# Patient Record
Sex: Female | Born: 1955 | Race: White | Hispanic: No | Marital: Married | Smoking: Never smoker
Health system: Southern US, Community
[De-identification: ages and names within clinical notes are randomized; demographics above are authoritative.]

## PROBLEM LIST (undated history)

## (undated) DIAGNOSIS — R110 Nausea: Secondary | ICD-10-CM

## (undated) DIAGNOSIS — R1310 Dysphagia, unspecified: Secondary | ICD-10-CM

## (undated) DIAGNOSIS — J45909 Unspecified asthma, uncomplicated: Secondary | ICD-10-CM

## (undated) DIAGNOSIS — R059 Cough, unspecified: Secondary | ICD-10-CM

## (undated) DIAGNOSIS — J029 Acute pharyngitis, unspecified: Secondary | ICD-10-CM

## (undated) DIAGNOSIS — R109 Unspecified abdominal pain: Secondary | ICD-10-CM

## (undated) DIAGNOSIS — E079 Disorder of thyroid, unspecified: Secondary | ICD-10-CM

## (undated) DIAGNOSIS — Z8639 Personal history of other endocrine, nutritional and metabolic disease: Secondary | ICD-10-CM

## (undated) DIAGNOSIS — R0982 Postnasal drip: Secondary | ICD-10-CM

## (undated) DIAGNOSIS — M549 Dorsalgia, unspecified: Secondary | ICD-10-CM

## (undated) DIAGNOSIS — R002 Palpitations: Secondary | ICD-10-CM

## (undated) DIAGNOSIS — R05 Cough: Secondary | ICD-10-CM

## (undated) DIAGNOSIS — R079 Chest pain, unspecified: Secondary | ICD-10-CM

## (undated) DIAGNOSIS — R682 Dry mouth, unspecified: Secondary | ICD-10-CM

## (undated) DIAGNOSIS — E785 Hyperlipidemia, unspecified: Secondary | ICD-10-CM

## (undated) DIAGNOSIS — E063 Autoimmune thyroiditis: Secondary | ICD-10-CM

## (undated) DIAGNOSIS — R634 Abnormal weight loss: Secondary | ICD-10-CM

## (undated) DIAGNOSIS — J3089 Other allergic rhinitis: Secondary | ICD-10-CM

## (undated) DIAGNOSIS — T17308A Unspecified foreign body in larynx causing other injury, initial encounter: Secondary | ICD-10-CM

## (undated) DIAGNOSIS — R0602 Shortness of breath: Secondary | ICD-10-CM

## (undated) DIAGNOSIS — K219 Gastro-esophageal reflux disease without esophagitis: Secondary | ICD-10-CM

## (undated) HISTORY — PX: OTHER SURGICAL HISTORY: SHX169

## (undated) HISTORY — DX: Other allergic rhinitis: J30.89

## (undated) HISTORY — DX: Abnormal weight loss: R63.4

## (undated) HISTORY — DX: Dysphagia, unspecified: R13.10

## (undated) HISTORY — DX: Chest pain, unspecified: R07.9

## (undated) HISTORY — DX: Autoimmune thyroiditis: E06.3

## (undated) HISTORY — DX: Unspecified asthma, uncomplicated: J45.909

## (undated) HISTORY — DX: Hyperlipidemia, unspecified: E78.5

## (undated) HISTORY — DX: Disorder of thyroid, unspecified: E07.9

## (undated) HISTORY — DX: Palpitations: R00.2

## (undated) HISTORY — DX: Cough: R05

## (undated) HISTORY — DX: Shortness of breath: R06.02

## (undated) HISTORY — DX: Nausea: R11.0

## (undated) HISTORY — DX: Dorsalgia, unspecified: M54.9

## (undated) HISTORY — DX: Dry mouth, unspecified: R68.2

## (undated) HISTORY — DX: Gastro-esophageal reflux disease without esophagitis: K21.9

## (undated) HISTORY — DX: Unspecified foreign body in larynx causing other injury, initial encounter: T17.308A

## (undated) HISTORY — PX: CHOLECYSTECTOMY: SHX55

## (undated) HISTORY — DX: Postnasal drip: R09.82

## (undated) HISTORY — PX: COLONOSCOPY: SHX174

## (undated) HISTORY — DX: Personal history of other endocrine, nutritional and metabolic disease: Z86.39

## (undated) HISTORY — DX: Unspecified abdominal pain: R10.9

## (undated) HISTORY — DX: Cough, unspecified: R05.9

## (undated) HISTORY — DX: Acute pharyngitis, unspecified: J02.9

---

## 1998-04-27 ENCOUNTER — Other Ambulatory Visit: Admission: RE | Admit: 1998-04-27 | Discharge: 1998-04-27 | Payer: Self-pay | Admitting: *Deleted

## 1999-03-17 ENCOUNTER — Ambulatory Visit (HOSPITAL_COMMUNITY): Admission: RE | Admit: 1999-03-17 | Discharge: 1999-03-17 | Payer: Self-pay | Admitting: Obstetrics and Gynecology

## 1999-03-17 ENCOUNTER — Encounter: Payer: Self-pay | Admitting: Obstetrics and Gynecology

## 2001-11-01 ENCOUNTER — Encounter: Admission: RE | Admit: 2001-11-01 | Discharge: 2001-11-01 | Payer: Self-pay | Admitting: Endocrinology

## 2001-11-01 ENCOUNTER — Encounter: Payer: Self-pay | Admitting: Endocrinology

## 2001-11-22 ENCOUNTER — Encounter: Payer: Self-pay | Admitting: Internal Medicine

## 2001-11-22 ENCOUNTER — Ambulatory Visit (HOSPITAL_COMMUNITY): Admission: RE | Admit: 2001-11-22 | Discharge: 2001-11-22 | Payer: Self-pay | Admitting: *Deleted

## 2001-12-12 ENCOUNTER — Ambulatory Visit (HOSPITAL_COMMUNITY): Admission: RE | Admit: 2001-12-12 | Discharge: 2001-12-12 | Payer: Self-pay

## 2002-06-28 ENCOUNTER — Ambulatory Visit (HOSPITAL_COMMUNITY): Admission: RE | Admit: 2002-06-28 | Discharge: 2002-06-28 | Payer: Self-pay | Admitting: Endocrinology

## 2002-06-28 ENCOUNTER — Encounter: Payer: Self-pay | Admitting: Internal Medicine

## 2002-06-28 ENCOUNTER — Encounter: Payer: Self-pay | Admitting: Endocrinology

## 2002-08-07 ENCOUNTER — Encounter: Payer: Self-pay | Admitting: Endocrinology

## 2002-08-07 ENCOUNTER — Encounter: Admission: RE | Admit: 2002-08-07 | Discharge: 2002-08-07 | Payer: Self-pay | Admitting: Endocrinology

## 2002-08-07 ENCOUNTER — Encounter: Payer: Self-pay | Admitting: Internal Medicine

## 2002-10-08 ENCOUNTER — Encounter: Payer: Self-pay | Admitting: General Surgery

## 2002-10-09 ENCOUNTER — Ambulatory Visit (HOSPITAL_COMMUNITY): Admission: RE | Admit: 2002-10-09 | Discharge: 2002-10-10 | Payer: Self-pay | Admitting: General Surgery

## 2002-10-09 ENCOUNTER — Encounter: Payer: Self-pay | Admitting: Internal Medicine

## 2002-10-09 ENCOUNTER — Encounter (INDEPENDENT_AMBULATORY_CARE_PROVIDER_SITE_OTHER): Payer: Self-pay | Admitting: *Deleted

## 2002-10-09 ENCOUNTER — Encounter: Payer: Self-pay | Admitting: General Surgery

## 2003-06-16 ENCOUNTER — Other Ambulatory Visit: Admission: RE | Admit: 2003-06-16 | Discharge: 2003-06-16 | Payer: Self-pay | Admitting: Obstetrics and Gynecology

## 2003-07-12 ENCOUNTER — Emergency Department (HOSPITAL_COMMUNITY): Admission: EM | Admit: 2003-07-12 | Discharge: 2003-07-13 | Payer: Self-pay | Admitting: Emergency Medicine

## 2003-09-29 ENCOUNTER — Encounter: Admission: RE | Admit: 2003-09-29 | Discharge: 2003-09-29 | Payer: Self-pay | Admitting: Obstetrics and Gynecology

## 2004-12-28 ENCOUNTER — Encounter: Admission: RE | Admit: 2004-12-28 | Discharge: 2004-12-28 | Payer: Self-pay | Admitting: Obstetrics and Gynecology

## 2005-02-10 ENCOUNTER — Encounter: Admission: RE | Admit: 2005-02-10 | Discharge: 2005-02-10 | Payer: Self-pay | Admitting: Obstetrics and Gynecology

## 2006-05-02 ENCOUNTER — Encounter: Admission: RE | Admit: 2006-05-02 | Discharge: 2006-05-02 | Payer: Self-pay | Admitting: Obstetrics and Gynecology

## 2007-05-04 ENCOUNTER — Encounter: Admission: RE | Admit: 2007-05-04 | Discharge: 2007-05-04 | Payer: Self-pay | Admitting: Obstetrics

## 2008-01-18 DIAGNOSIS — J301 Allergic rhinitis due to pollen: Secondary | ICD-10-CM | POA: Insufficient documentation

## 2008-01-18 DIAGNOSIS — J45909 Unspecified asthma, uncomplicated: Secondary | ICD-10-CM | POA: Insufficient documentation

## 2008-01-18 DIAGNOSIS — E063 Autoimmune thyroiditis: Secondary | ICD-10-CM | POA: Insufficient documentation

## 2008-01-22 ENCOUNTER — Ambulatory Visit: Payer: Self-pay | Admitting: Internal Medicine

## 2008-01-22 DIAGNOSIS — R109 Unspecified abdominal pain: Secondary | ICD-10-CM | POA: Insufficient documentation

## 2008-01-22 DIAGNOSIS — K219 Gastro-esophageal reflux disease without esophagitis: Secondary | ICD-10-CM | POA: Insufficient documentation

## 2008-01-23 LAB — CONVERTED CEMR LAB
Albumin: 3.5 g/dL (ref 3.5–5.2)
Amylase: 46 units/L (ref 27–131)
BUN: 10 mg/dL (ref 6–23)
Basophils Relative: 0.5 % (ref 0.0–3.0)
CO2: 29 meq/L (ref 19–32)
Calcium: 8.4 mg/dL (ref 8.4–10.5)
Chloride: 104 meq/L (ref 96–112)
Creatinine, Ser: 0.7 mg/dL (ref 0.4–1.2)
Eosinophils Absolute: 0.2 10*3/uL (ref 0.0–0.7)
Eosinophils Relative: 2.7 % (ref 0.0–5.0)
GFR calc Af Amer: 113 mL/min
GFR calc non Af Amer: 94 mL/min
Glucose, Bld: 111 mg/dL — ABNORMAL HIGH (ref 70–99)
Lipase: 28 units/L (ref 11.0–59.0)
Lymphocytes Relative: 29.7 % (ref 12.0–46.0)
MCV: 93.6 fL (ref 78.0–100.0)
Monocytes Relative: 9.2 % (ref 3.0–12.0)
Neutrophils Relative %: 57.9 % (ref 43.0–77.0)
Potassium: 4.1 meq/L (ref 3.5–5.1)
RBC: 4.09 M/uL (ref 3.87–5.11)
WBC: 6.3 10*3/uL (ref 4.5–10.5)

## 2008-02-05 ENCOUNTER — Telehealth: Payer: Self-pay | Admitting: Internal Medicine

## 2008-02-19 ENCOUNTER — Telehealth: Payer: Self-pay | Admitting: Internal Medicine

## 2008-02-25 ENCOUNTER — Telehealth: Payer: Self-pay | Admitting: Internal Medicine

## 2008-03-14 ENCOUNTER — Ambulatory Visit: Payer: Self-pay | Admitting: Internal Medicine

## 2008-03-18 ENCOUNTER — Ambulatory Visit: Payer: Self-pay | Admitting: Internal Medicine

## 2008-03-21 ENCOUNTER — Telehealth: Payer: Self-pay | Admitting: Internal Medicine

## 2008-03-21 ENCOUNTER — Ambulatory Visit: Payer: Self-pay | Admitting: Internal Medicine

## 2008-03-21 DIAGNOSIS — R1031 Right lower quadrant pain: Secondary | ICD-10-CM | POA: Insufficient documentation

## 2008-03-25 ENCOUNTER — Encounter: Payer: Self-pay | Admitting: Internal Medicine

## 2008-03-31 ENCOUNTER — Telehealth: Payer: Self-pay | Admitting: Internal Medicine

## 2008-12-25 ENCOUNTER — Encounter: Admission: RE | Admit: 2008-12-25 | Discharge: 2008-12-25 | Payer: Self-pay | Admitting: Obstetrics

## 2009-04-14 ENCOUNTER — Telehealth (INDEPENDENT_AMBULATORY_CARE_PROVIDER_SITE_OTHER): Payer: Self-pay | Admitting: *Deleted

## 2010-01-21 ENCOUNTER — Encounter: Admission: RE | Admit: 2010-01-21 | Discharge: 2010-01-21 | Payer: Self-pay | Admitting: Obstetrics

## 2010-01-25 ENCOUNTER — Encounter: Payer: Self-pay | Admitting: Cardiovascular Disease

## 2010-01-27 ENCOUNTER — Encounter: Payer: Self-pay | Admitting: Cardiovascular Disease

## 2010-01-28 DIAGNOSIS — R002 Palpitations: Secondary | ICD-10-CM | POA: Insufficient documentation

## 2010-01-29 ENCOUNTER — Ambulatory Visit: Payer: Self-pay | Admitting: Cardiovascular Disease

## 2010-02-09 ENCOUNTER — Encounter: Payer: Self-pay | Admitting: Cardiovascular Disease

## 2010-02-09 ENCOUNTER — Ambulatory Visit: Payer: Self-pay

## 2010-02-09 ENCOUNTER — Ambulatory Visit: Payer: Self-pay | Admitting: Cardiovascular Disease

## 2010-02-09 ENCOUNTER — Ambulatory Visit (HOSPITAL_COMMUNITY)
Admission: RE | Admit: 2010-02-09 | Discharge: 2010-02-09 | Payer: Self-pay | Source: Home / Self Care | Attending: Cardiovascular Disease | Admitting: Cardiovascular Disease

## 2010-03-17 ENCOUNTER — Ambulatory Visit: Admit: 2010-03-17 | Payer: Self-pay | Admitting: Cardiovascular Disease

## 2010-03-17 ENCOUNTER — Telehealth (INDEPENDENT_AMBULATORY_CARE_PROVIDER_SITE_OTHER): Payer: Self-pay | Admitting: *Deleted

## 2010-03-22 ENCOUNTER — Telehealth: Payer: Self-pay | Admitting: Cardiovascular Disease

## 2010-03-25 NOTE — Progress Notes (Signed)
Summary: [employer] Savings Card  Phone Note Call from Patient   Caller: Spouse Summary of Call: Husband requested new [employer] savings card for wife while on call for himself.  New card placed at front desk for Mr. Dandy to pick up. Initial call taken by: Francee Piccolo CMA Duncan Dull),  April 14, 2009 10:31 AM

## 2010-03-25 NOTE — Letter (Signed)
Summary: [facility]  [facility]   Imported By: Marylou Mccoy 01/29/2010 09:46:08  _____________________________________________________________________  External Attachment:    Type:   Image     Comment:   External Document

## 2010-03-25 NOTE — Assessment & Plan Note (Signed)
Summary: NP3/PALPITATIONS/LG   Visit Type:  Follow-up Primary Provider:  Adela Lank, MD  CC:  Heavy Palpitations / Minor CP / tachycardia.  History of Present Illness: Donna Hicks is seen at the request of Dr Juleen China.  She has had increased palpitations since Wendsday.  She is not sure what her TSH/T4 were but Dr Juleen China wanted to decrease her synthroid.  She is hesitant to do this.  I told her if her TSH was supressed this could be causing her palpitaitons.  She has no previous history of cardiac disease.  She works out 3x/week with her husband at [employer] without difficulty.  She also works full time at [employer].  Denies SSCP, syncope, dyspnea or diaphoresis.  Was started on lopresser Friday and this has helped her palpitatoins.    Current Problems (verified): 1)  Palpitations  (ICD-785.1) 2)  Abdominal Pain Right Lower Quadrant  (ICD-789.03) 3)  Special Screening For Malignant Neoplasms Colon  (ICD-V76.51) 4)  Gerd  (ICD-530.81) 5)  Abdominal Pain, Upper  (ICD-789.09) 6)  Allergic Rhinitis, Seasonal  (ICD-477.0) 7)  Hashimoto's Thyroiditis  (ICD-245.2) 8)  Asthma  (ICD-493.90)  Current Medications (verified): 1)  Synthroid 112 Mcg Tabs (Levothyroxine Sodium) .Marland Kitchen.. 1 Tablet By Mouth Once Daily 2)  Aciphex 20 Mg Tbec (Rabeprazole Sodium) .... Once Daily 3)  Vitamin D 400 Unit  Tabs (Cholecalciferol) .... Once Daily 4)  Calcium Carbonate-Vitamin D 600-400 Mg-Unit  Tabs (Calcium Carbonate-Vitamin D) .... Once Daily 5)  Multivitamins   Tabs (Multiple Vitamin) .... Once Daily 6)  Claritin-D 12 Hour 5-120 Mg Xr12h-Tab (Loratadine-Pseudoephedrine) .... As Needed 7)  Metoprolol Succinate 50 Mg Xr24h-Tab (Metoprolol Succinate) .... Take One Tablet By Mouth Daily  Allergies (verified): 1)  ! Ampicillin  Past History:  Past Medical History: Last updated: 01/28/2010 PALPITATIONS  ABDOMINAL PAIN RIGHT LOWER QUADRANT  SPECIAL SCREENING FOR MALIGNANT NEOPLASMS COLON GERD ABDOMINAL PAIN, UPPER   HASHIMOTO'S THYROIDITIS ASTHMA   Hyperreactive airway disease/asthma Perennial allergic rhinitis History of Hashimoto thyroiditis  Past Surgical History: Last updated: 01/22/2008 Appendectomy Cholecystectomy  Family History: Last updated: 01/29/2010 Family History of Breast Cancer:Mother, Sister Family History of Diabetes: Mother, Brother Family History of Liver Disease/Cirrhosis:Brother Family History of Coronary Artery Disease: father (MI at 56) Family History of Alcoholism:  Brother Family History of CVA or Stroke: Grandmother Family History of Hyperlipidemia:  Brother Family History of Hypertension: Brother Family History of Thyroid Disease: Brother  Social History: Last updated: 01/29/2010 Married, 1 son 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 (early 9604'V) Alcohol Use - no Daily Caffeine Use 4 cups/day of coffee Illicit Drug Use - no Patient gets regular exercise. 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   Vital Signs:  Patient profile:   55 year old female Height:      61 inches Weight:      128 pounds BMI:     24.27 Pulse rate:   85 / minute BP sitting:   112 / 72  (left arm) Cuff size:   regular  Vitals Entered By: Hardin Negus, RMA (January 29, 2010 3:18 PM)  Physical Exam  General:  Affect appropriate Healthy:  appears stated age HEENT: normal Neck supple with no adenopathy JVP normal no bruits no thyromegaly Lungs clear with no wheezing and good diaphragmatic motion Heart:  S1/S2 no murmur,rub, gallop or click PMI normal Abdomen: benighn, BS positve, no tenderness, no AAA no bruit.  No HSM or HJR Distal pulses intact with no bruits No edema Neuro non-focal Skin warm and dry    Impression & Recommendations:  Problem # 1:  PALPITATIONS (ICD-785.1) PAC's on exam.  Baseline ECG normal.  F/U event monitor and echo.  Will need to review thyroid studies from Dr Juleen China.  Continue BB for now Her updated medication list for this problem includes:    Metoprolol Succinate 50 Mg Xr24h-tab (Metoprolol succinate) .Marland Kitchen... Take one tablet by mouth daily  Orders: Echocardiogram (Echo) Event (Event)  Problem # 2:  HASHIMOTO'S THYROIDITIS (ICD-245.2) Certainly over-replacement could be causing this.  Will get labs from Dr Juleen China and may need free T3 and other labs as well  Patient Instructions: 1)  Your physician recommends that you schedule a follow-up appointment in: 1 month 2)  Your physician recommends that you continue on your current medications as directed. Please refer to the Current Medication list given to you today. 3)  Your physician has requested that you have an echocardiogram.  Echocardiography is a painless test that uses sound waves to create images of your heart. It provides your doctor with information about the size and shape of your heart and how well your heart's chambers and valves are working.  This procedure takes  approximately one hour. There are no restrictions for this procedure. 4)  Your physician has recommended that you wear an event monitor.  Event monitors are medical devices that record the heart's electrical activity. Doctors most often use these monitors to diagnose arrhythmias. Arrhythmias are problems with the speed or rhythm of the heartbeat. The monitor is a small, portable device. You can wear one while you do your normal daily activities. This is usually used to diagnose what is causing palpitations/syncope (passing out).   EKG Report  Procedure date:  01/25/2010  Findings:      NSR 89 Normal ECG

## 2010-03-31 NOTE — Progress Notes (Signed)
Summary: PT RTN CALL FROM LAST WEEK  Phone Note Call from Patient Call back at Work Phone [phone] Call back at EXT 100   Caller: Patient Reason for Call: Talk to Nurse, Talk to Doctor Summary of Call: PT RTN CALL FROM CHRISTINE FROM LAST WEEK PT NOT SURE WHAT THE CALL IS FOR Initial call taken by: Omer Jack,  March 22, 2010 11:16 AM  Follow-up for Phone Call        Phone Call Completed PT AWARE OF MONTOR RESULTS. Follow-up by: Scherrie Bateman, LPN,  March 22, 2010 4:18 PM

## 2010-03-31 NOTE — Progress Notes (Signed)
  Phone Note Outgoing Call   Call placed by: Scherrie Bateman, LPN,  March 17, 2010 1:10 PM Call placed to: Patient Summary of Call: LEFT MESSAGE FOR PT TO CALL BACK RE MONITOR RESULTS PER DR NISHAN NSR NO SIG ARRYTHMIAS. Initial call taken by: Scherrie Bateman, LPN,  March 17, 2010 1:10 PM  Follow-up for Phone Call        Phone call completed PT AWARE OF MONITORE RESULTS. Follow-up by: Scherrie Bateman, LPN,  March 22, 2010 4:18 PM

## 2010-04-06 ENCOUNTER — Encounter: Payer: Self-pay | Admitting: Cardiovascular Disease

## 2010-04-06 ENCOUNTER — Ambulatory Visit (INDEPENDENT_AMBULATORY_CARE_PROVIDER_SITE_OTHER): Payer: BC Managed Care – PPO | Admitting: Cardiovascular Disease

## 2010-04-06 DIAGNOSIS — R002 Palpitations: Secondary | ICD-10-CM

## 2010-04-14 NOTE — Assessment & Plan Note (Signed)
Summary: 1 month - rov - gd appt confirm=mj   Primary Provider:  Adela Lank, MD  CC:  check up.  History of Present Illness: Donna Hicks is seen at the request of Dr Juleen China in December  She  had increased palpitations TSH was suppressed and synthroid  was adjusted  She has no previous history of cardiac disease.  She works out 3x/week with her husband at [place] without difficulty.  She also works full time at [employer].  Denies SSCP, syncope, dyspnea or diaphoresis.   Echo in 12/11 was normal.  Reviewed event monitor and normal.  She indicates that eating smaller more frequent meals has helped palpitations.  Wants to try to stop BB as max HR with exercise only 125.  I told her this was fine.  Will see PRN  Current Problems (verified): 1)  Palpitations  (ICD-785.1) 2)  Abdominal Pain Right Lower Quadrant  (ICD-789.03) 3)  Special Screening For Malignant Neoplasms Colon  (ICD-V76.51) 4)  Gerd  (ICD-530.81) 5)  Abdominal Pain, Upper  (ICD-789.09) 6)  Allergic Rhinitis, Seasonal  (ICD-477.0) 7)  Hashimoto's Thyroiditis  (ICD-245.2) 8)  Asthma  (ICD-493.90)  Current Medications (verified): 1)  Synthroid 112 Mcg Tabs (Levothyroxine Sodium) .Marland Kitchen.. 1 Tablet By Mouth Once Daily....altrenating With 2)  Aciphex 20 Mg Tbec (Rabeprazole Sodium) .... Once Daily 3)  Vitamin D 400 Unit  Tabs (Cholecalciferol) .... Once Daily 4)  Calcium Carbonate-Vitamin D 600-400 Mg-Unit  Tabs (Calcium Carbonate-Vitamin D) .... Once Daily 5)  Multivitamins   Tabs (Multiple Vitamin) .... Once Daily 6)  Claritin-D 12 Hour 5-120 Mg Xr12h-Tab (Loratadine-Pseudoephedrine) .... As Needed 7)  Metoprolol Succinate 50 Mg Xr24h-Tab (Metoprolol Succinate) .... Take One Tablet By Mouth Daily  Allergies (verified): 1)  ! Ampicillin  Past History:  Past Medical History: Last updated: 01/28/2010 PALPITATIONS  ABDOMINAL PAIN RIGHT LOWER QUADRANT  SPECIAL SCREENING FOR MALIGNANT NEOPLASMS COLON GERD ABDOMINAL PAIN,  UPPER  HASHIMOTO'S THYROIDITIS ASTHMA   Hyperreactive airway disease/asthma Perennial allergic rhinitis History of Hashimoto thyroiditis  Past Surgical History: Last updated: 01/22/2008 Appendectomy Cholecystectomy  Family History: Last updated: 01/29/2010 Family History of Breast Cancer:Mother, Sister Family History of Diabetes: Mother, Brother Family History of Liver Disease/Cirrhosis:Brother Family History of Coronary Artery Disease: father (MI at 39) Family History of Alcoholism:  Brother Family History of CVA or Stroke: Grandmother Family History of Hyperlipidemia:  Brother Family History of Hypertension: Brother Family History of Thyroid Disease: Brother  Social History: Last updated: 01/29/2010 Married, 1 son 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   Vital Signs:  Patient profile:   55 year old female Height:      61 inches Weight:      131 pounds BMI:     24.84 Pulse rate:   67 / minute Resp:     12 per minute BP sitting:   107 / 79  (right arm)  Vitals Entered By: Kem Parkinson (April 06, 2010 12:09 PM)  Physical Exam  General:  Affect appropriate Healthy:  appears stated age HEENT: normal Neck supple with no adenopathy  JVP normal no bruits no thyromegaly Lungs clear with no wheezing and good diaphragmatic motion Heart:  S1/S2 no murmur,rub, gallop or click PMI normal Abdomen: benighn, BS positve, no tenderness, no AAA no bruit.  No HSM or HJR Distal pulses intact with no bruits No  edema Neuro non-focal Skin warm and dry    Impression & Recommendations:  Problem # 1:  PALPITATIONS (ICD-785.1) Benign related to diet and thyroid meds.  Improved  Triial off BB and as needed F/U Her updated medication list for this problem includes:    Metoprolol Succinate 50 Mg Xr24h-tab (Metoprolol succinate) .Marland Kitchen... Take one tablet by mouth daily

## 2010-06-04 ENCOUNTER — Encounter: Payer: BC Managed Care – PPO | Admitting: Genetic Counselor

## 2010-07-09 NOTE — Op Note (Signed)
   NAME:  Donna Hicks, Donna Hicks                        ACCOUNT NO.:  [id number]   MEDICAL RECORD NO.:  [id number]                   PATIENT TYPE:  AMB   LOCATION:  ENDO                                 FACILITY:  [facility]   PHYSICIAN:  Georgiana Spinner, M.D.                 DATE OF BIRTH:  1955-05-20   DATE OF PROCEDURE:  11/22/2001  DATE OF DISCHARGE:                                 OPERATIVE REPORT   PROCEDURE:  Upper endoscopy.   INDICATIONS:  GERD.   ANESTHESIA:  Demerol 80 mg, Versed 8 mg IV.   DESCRIPTION OF PROCEDURE:  With the patient mildly sedated in the left  lateral decubitus position, the Olympus videoscopic endoscope was inserted  in the mouth, down the pharynx under direct vision through the esophagus  which appeared normal into the stomach. The fundus, body and antrum,  duodenal bulb and second portion of duodenum all appeared normal. From this  point the endoscope was slowly withdrawn taking circumferential views of the  entire duodenal mucosa until the endoscope was pulled back into the stomach  and on retroflexion in viewing the stomach from below. The endoscope was  then straightened and withdrawn taking circumferential views of the  remaining gastric and esophageal mucosa. The patient vital signs and pulse  oximeter remained stable. The patient tolerated the procedure well with no  apparent complications.   FINDINGS:  Unremarkable examination.   PLAN:  Have the patient follow up with me as an outpatient.                                                Georgiana Spinner, M.D.    GMO/MEDQ  D:  11/22/2001  T:  11/23/2001  Job:  [id number]

## 2010-07-09 NOTE — Op Note (Signed)
NAME:  Donna Hicks, Donna Hicks                        ACCOUNT NO.:  [id number]   MEDICAL RECORD NO.:  [id number]                   PATIENT TYPE:  OIB   LOCATION:  5731                                 FACILITY:  [facility]   PHYSICIAN:  Jimmye Norman III, M.D.               DATE OF BIRTH:  09/21/1955   DATE OF PROCEDURE:  10/09/2002  DATE OF DISCHARGE:                                 OPERATIVE REPORT   PREOPERATIVE DIAGNOSIS:  Biliary dyskinesia with abdominal pain.   POSTOPERATIVE DIAGNOSIS:  1. Biliary dyskinesia with abdominal pain.  2. Significant abdominal adhesions.   PROCEDURES:  1. Laparoscopic cholecystectomy.  2. Intraoperative cholangiogram.  3. Laparoscopic enterolysis.   SURGEON:  Jimmye Norman, M.D.   ANESTHESIA:  General endotracheal.   ESTIMATED BLOOD LOSS:  Less than 20 mL.   COMPLICATIONS:  None.   CONDITION:  Stable.   INDICATION FOR OPERATION:  The patient is a 55 year old female with  significant abdominal pain in the right upper quadrant and tenderness when  lying on her right side, who comes in for an elective laparoscopic  cholecystectomy.   The patient had had previous studies which demonstrated no evidence of  gallstones.  She did have biliary dyskinesia.  A HIDA scan was abnormal.  For these reasons and her recurrent symptoms in a postprandial manner, a  laparoscopic cholecystectomy was done.   OPERATION:  The patient was taken to the operating room and placed on the  table in the supine position.  After an adequate amount of IV sedation was  given and endotracheal intubation was done, she was prepped and draped in  the usual sterile manner exposing the midline and right upper quadrant.   The patient was placed in reverse Trendelenburg.  A supraumbilical  curvilinear incision was made using a #11 blade and taken down to the  midline fascia.  It was through this midline fascia that a Veress needle was  placed into the peritoneal cavity and confirmed to be  in position with the  saline test.  Once this was done, carbon dioxide insufflation was instilled  into the peritoneal cavity up to a maximum pressure of 15 mmHg.  We then  passed the 11/12 mm cannula and trocar into the peritoneal cavity and  confirmed its intraperitoneal position using the laparoscope with attached  camera and light source.  Subsequently two right costal margin 5 mm cannulas  and a subxiphoid 11/12 mm cannula were passed under direct vision into the  peritoneal cavity.  The patient was placed in steeper reverse Trendelenburg,  the left side was tilted down, and the dissection begun.   There were adhesions to the right lobe of the liver, to the abdominal wall,  and also to the colon and the omentum.  These were taken down using blunt  dissection with the dissector.  We then dissected out the peritoneum  overlying the triangle of Calot and hepatoduodenal  triangle as we retracted  the gallbladder toward the anterior abdominal wall and the right upper  quadrant.  We were able to isolate the cystic duct and the cystic artery,  endoclipping the cystic artery with a single clip and the cystic duct along  the gallbladder side.  We made a cholecystodochotomy through which the  Reddick catheter was passed for the cholangiogram.  A picture of the  cholangiogram is attached on the chart.  It showed good proximal flow and  also distal flow into the duodenum without obstruction.  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   The catheter was removed, the cystic duct clamped using double Endoclips,  and then subsequently transected.  We then dissected the gallbladder out of  its bed.  There was minimal difficulty, using an EndoCatch bag to pull it  out through the supraumbilical site.   Once we had done so, we used an Endo-Scissors in order to take down the  adhesions that were in the left lower quadrant and lower midline of  the  abdomen and the left mid- to upper portion of the abdomen.  This was done  completely and with minimal bleeding.  Once this was done, we removed all  cannulas, irrigated with saline, and found there to be minimal bleeding.  We  removed all cannulas and subsequently closed.  The fascia was closed in the  supraumbilical are using a figure-of-eight stitch of 0 Vicryl.  Once this  was done a subcuticular stitch of 4-0 Vicryl was placed in the skin.  Sterile dressings were applied at all incisions, which were closed with 4-0  Vicryl.  We injected all sites with 0.25% Marcaine with epinephrine.  All  needle counts, sponge counts, and instrument counts were correct.  The  patient was taken to the recovery room in stable condition.                                               Kathrin Ruddy, M.D.    JW/MEDQ  D:  10/09/2002  T:  10/10/2002  Job:  [id number]

## 2010-08-02 ENCOUNTER — Other Ambulatory Visit: Payer: Self-pay | Admitting: Gastroenterology

## 2010-08-02 MED ORDER — RABEPRAZOLE SODIUM 20 MG PO TBEC: 20.0000 mg | DELAYED_RELEASE_TABLET | Freq: Every day | ORAL | Status: DC

## 2010-08-02 NOTE — Telephone Encounter (Signed)
Medication for Aciphex refilled for #30 only. Patient needs an appointment!!

## 2011-10-17 ENCOUNTER — Other Ambulatory Visit: Payer: Self-pay | Admitting: Obstetrics

## 2011-10-17 DIAGNOSIS — Z1231 Encounter for screening mammogram for malignant neoplasm of breast: Secondary | ICD-10-CM

## 2011-10-27 ENCOUNTER — Encounter: Payer: Self-pay | Admitting: *Deleted

## 2011-10-27 ENCOUNTER — Encounter: Payer: Self-pay | Admitting: Cardiovascular Disease

## 2011-11-10 ENCOUNTER — Ambulatory Visit
Admission: RE | Admit: 2011-11-10 | Discharge: 2011-11-10 | Disposition: A | Discharge status: Home / Self Care | Payer: BC Managed Care – PPO | Source: Ambulatory Visit | Attending: Obstetrics | Admitting: Obstetrics

## 2011-11-10 DIAGNOSIS — Z1231 Encounter for screening mammogram for malignant neoplasm of breast: Secondary | ICD-10-CM

## 2012-10-02 ENCOUNTER — Other Ambulatory Visit: Payer: Self-pay

## 2012-10-02 DIAGNOSIS — Z1231 Encounter for screening mammogram for malignant neoplasm of breast: Secondary | ICD-10-CM

## 2012-11-12 ENCOUNTER — Ambulatory Visit
Admission: RE | Admit: 2012-11-12 | Discharge: 2012-11-12 | Disposition: A | Discharge status: Home / Self Care | Payer: BC Managed Care – PPO | Source: Ambulatory Visit

## 2012-11-12 DIAGNOSIS — Z1231 Encounter for screening mammogram for malignant neoplasm of breast: Secondary | ICD-10-CM

## 2014-12-24 ENCOUNTER — Encounter: Payer: Self-pay | Admitting: Internal Medicine

## 2015-05-26 ENCOUNTER — Other Ambulatory Visit: Payer: Self-pay

## 2015-05-26 DIAGNOSIS — Z1231 Encounter for screening mammogram for malignant neoplasm of breast: Secondary | ICD-10-CM

## 2015-06-11 ENCOUNTER — Ambulatory Visit
Admission: RE | Admit: 2015-06-11 | Discharge: 2015-06-11 | Disposition: A | Discharge status: Home / Self Care | Payer: BLUE CROSS/BLUE SHIELD | Source: Ambulatory Visit

## 2015-06-11 DIAGNOSIS — Z1231 Encounter for screening mammogram for malignant neoplasm of breast: Secondary | ICD-10-CM

## 2016-06-27 DIAGNOSIS — G4700 Insomnia, unspecified: Secondary | ICD-10-CM | POA: Diagnosis not present

## 2016-06-27 DIAGNOSIS — R000 Tachycardia, unspecified: Secondary | ICD-10-CM | POA: Diagnosis not present

## 2016-06-27 DIAGNOSIS — E039 Hypothyroidism, unspecified: Secondary | ICD-10-CM | POA: Diagnosis not present

## 2016-08-12 DIAGNOSIS — J019 Acute sinusitis, unspecified: Secondary | ICD-10-CM | POA: Diagnosis not present

## 2016-08-22 DIAGNOSIS — E039 Hypothyroidism, unspecified: Secondary | ICD-10-CM | POA: Diagnosis not present

## 2016-12-27 DIAGNOSIS — Z23 Encounter for immunization: Secondary | ICD-10-CM | POA: Diagnosis not present

## 2017-03-20 ENCOUNTER — Other Ambulatory Visit: Payer: Self-pay | Admitting: Obstetrics

## 2017-03-20 DIAGNOSIS — Z1231 Encounter for screening mammogram for malignant neoplasm of breast: Secondary | ICD-10-CM

## 2017-03-22 ENCOUNTER — Ambulatory Visit
Admission: RE | Admit: 2017-03-22 | Discharge: 2017-03-22 | Disposition: A | Discharge status: Home / Self Care | Payer: Self-pay | Source: Ambulatory Visit | Attending: Obstetrics | Admitting: Obstetrics

## 2017-03-22 DIAGNOSIS — Z1231 Encounter for screening mammogram for malignant neoplasm of breast: Secondary | ICD-10-CM

## 2017-03-23 DIAGNOSIS — G4700 Insomnia, unspecified: Secondary | ICD-10-CM | POA: Diagnosis not present

## 2017-03-23 DIAGNOSIS — R000 Tachycardia, unspecified: Secondary | ICD-10-CM | POA: Diagnosis not present

## 2017-03-23 DIAGNOSIS — E039 Hypothyroidism, unspecified: Secondary | ICD-10-CM | POA: Diagnosis not present

## 2017-03-23 DIAGNOSIS — Z0000 Encounter for general adult medical examination without abnormal findings: Secondary | ICD-10-CM | POA: Diagnosis not present

## 2017-03-23 DIAGNOSIS — E7800 Pure hypercholesterolemia, unspecified: Secondary | ICD-10-CM | POA: Diagnosis not present

## 2017-04-14 DIAGNOSIS — E039 Hypothyroidism, unspecified: Secondary | ICD-10-CM | POA: Diagnosis not present

## 2017-04-14 DIAGNOSIS — R000 Tachycardia, unspecified: Secondary | ICD-10-CM | POA: Diagnosis not present

## 2017-04-14 DIAGNOSIS — K219 Gastro-esophageal reflux disease without esophagitis: Secondary | ICD-10-CM | POA: Diagnosis not present

## 2017-04-14 DIAGNOSIS — R079 Chest pain, unspecified: Secondary | ICD-10-CM | POA: Diagnosis not present

## 2017-04-17 ENCOUNTER — Encounter: Payer: Self-pay | Admitting: Internal Medicine

## 2017-04-18 DIAGNOSIS — Z01411 Encounter for gynecological examination (general) (routine) with abnormal findings: Secondary | ICD-10-CM | POA: Diagnosis not present

## 2017-04-18 DIAGNOSIS — N941 Unspecified dyspareunia: Secondary | ICD-10-CM | POA: Diagnosis not present

## 2017-04-18 DIAGNOSIS — M549 Dorsalgia, unspecified: Secondary | ICD-10-CM | POA: Diagnosis not present

## 2017-04-18 DIAGNOSIS — Z01419 Encounter for gynecological examination (general) (routine) without abnormal findings: Secondary | ICD-10-CM | POA: Diagnosis not present

## 2017-04-18 DIAGNOSIS — Z6826 Body mass index (BMI) 26.0-26.9, adult: Secondary | ICD-10-CM | POA: Diagnosis not present

## 2017-04-18 DIAGNOSIS — N952 Postmenopausal atrophic vaginitis: Secondary | ICD-10-CM | POA: Diagnosis not present

## 2017-05-11 ENCOUNTER — Telehealth: Payer: Self-pay | Admitting: Internal Medicine

## 2017-05-11 DIAGNOSIS — R0602 Shortness of breath: Secondary | ICD-10-CM | POA: Diagnosis not present

## 2017-05-11 DIAGNOSIS — R1013 Epigastric pain: Secondary | ICD-10-CM | POA: Diagnosis not present

## 2017-05-11 DIAGNOSIS — K219 Gastro-esophageal reflux disease without esophagitis: Secondary | ICD-10-CM | POA: Diagnosis not present

## 2017-05-11 NOTE — Telephone Encounter (Signed)
Patient has been rescheduled to 05/23/17 with Hyacinth MeekerJennifer  Lemmon, PA

## 2017-05-18 ENCOUNTER — Emergency Department (HOSPITAL_BASED_OUTPATIENT_CLINIC_OR_DEPARTMENT_OTHER): Payer: BLUE CROSS/BLUE SHIELD

## 2017-05-18 ENCOUNTER — Other Ambulatory Visit: Payer: Self-pay

## 2017-05-18 ENCOUNTER — Emergency Department (HOSPITAL_BASED_OUTPATIENT_CLINIC_OR_DEPARTMENT_OTHER)
Admission: EM | Admit: 2017-05-18 | Discharge: 2017-05-18 | Disposition: A | Discharge status: Home / Self Care | Payer: BLUE CROSS/BLUE SHIELD | Attending: Emergency Medicine | Admitting: Emergency Medicine

## 2017-05-18 ENCOUNTER — Telehealth: Payer: Self-pay | Admitting: Internal Medicine

## 2017-05-18 ENCOUNTER — Encounter (HOSPITAL_BASED_OUTPATIENT_CLINIC_OR_DEPARTMENT_OTHER): Payer: Self-pay | Admitting: *Deleted

## 2017-05-18 DIAGNOSIS — R1010 Upper abdominal pain, unspecified: Secondary | ICD-10-CM | POA: Insufficient documentation

## 2017-05-18 DIAGNOSIS — R05 Cough: Secondary | ICD-10-CM | POA: Diagnosis not present

## 2017-05-18 DIAGNOSIS — R1013 Epigastric pain: Secondary | ICD-10-CM | POA: Diagnosis not present

## 2017-05-18 DIAGNOSIS — J45909 Unspecified asthma, uncomplicated: Secondary | ICD-10-CM | POA: Diagnosis not present

## 2017-05-18 DIAGNOSIS — K219 Gastro-esophageal reflux disease without esophagitis: Secondary | ICD-10-CM | POA: Diagnosis not present

## 2017-05-18 DIAGNOSIS — R1310 Dysphagia, unspecified: Secondary | ICD-10-CM | POA: Insufficient documentation

## 2017-05-18 DIAGNOSIS — Z79899 Other long term (current) drug therapy: Secondary | ICD-10-CM | POA: Insufficient documentation

## 2017-05-18 DIAGNOSIS — R079 Chest pain, unspecified: Secondary | ICD-10-CM

## 2017-05-18 DIAGNOSIS — R0789 Other chest pain: Secondary | ICD-10-CM | POA: Diagnosis not present

## 2017-05-18 DIAGNOSIS — E063 Autoimmune thyroiditis: Secondary | ICD-10-CM | POA: Insufficient documentation

## 2017-05-18 LAB — COMPREHENSIVE METABOLIC PANEL
ALT: 16 U/L (ref 14–54)
ANION GAP: 12 (ref 5–15)
AST: 20 U/L (ref 15–41)
Albumin: 4.4 g/dL (ref 3.5–5.0)
Alkaline Phosphatase: 49 U/L (ref 38–126)
BILIRUBIN TOTAL: 0.5 mg/dL (ref 0.3–1.2)
BUN: 9 mg/dL (ref 6–20)
CHLORIDE: 100 mmol/L — AB (ref 101–111)
CO2: 26 mmol/L (ref 22–32)
Calcium: 8.8 mg/dL — ABNORMAL LOW (ref 8.9–10.3)
Creatinine, Ser: 0.79 mg/dL (ref 0.44–1.00)
GFR calc Af Amer: >60 mL/min (ref >60)
Glucose, Bld: 107 mg/dL — ABNORMAL HIGH (ref 65–99)
POTASSIUM: 3.5 mmol/L (ref 3.5–5.1)
Sodium: 138 mmol/L (ref 135–145)
TOTAL PROTEIN: 6.8 g/dL (ref 6.5–8.1)

## 2017-05-18 LAB — CBC WITH DIFFERENTIAL/PLATELET
Basophils Absolute: 0 10*3/uL (ref 0.0–0.1)
Basophils Relative: 0 %
EOS ABS: 0 10*3/uL (ref 0.0–0.7)
EOS PCT: 1 %
HCT: 39.7 % (ref 36.0–46.0)
Hemoglobin: 13.3 g/dL (ref 12.0–15.0)
LYMPHS ABS: 1.3 10*3/uL (ref 0.7–4.0)
LYMPHS PCT: 27 %
MCH: 31.1 pg (ref 26.0–34.0)
MCHC: 33.5 g/dL (ref 30.0–36.0)
MCV: 92.8 fL (ref 78.0–100.0)
MONO ABS: 0.5 10*3/uL (ref 0.1–1.0)
Monocytes Relative: 9 %
Neutro Abs: 3 10*3/uL (ref 1.7–7.7)
Neutrophils Relative %: 63 %
PLATELETS: 270 10*3/uL (ref 150–400)
RBC: 4.28 MIL/uL (ref 3.87–5.11)
RDW: 11.9 % (ref 11.5–15.5)
WBC: 4.8 10*3/uL (ref 4.0–10.5)

## 2017-05-18 LAB — TROPONIN I: Troponin I: <0.03 ng/mL (ref <0.03)

## 2017-05-18 LAB — LIPASE, BLOOD: LIPASE: 26 U/L (ref 11–51)

## 2017-05-18 LAB — BRAIN NATRIURETIC PEPTIDE: B NATRIURETIC PEPTIDE 5: 32.5 pg/mL (ref 0.0–100.0)

## 2017-05-18 MED ORDER — GI COCKTAIL ~~LOC~~
30.0000 mL | Freq: Once | ORAL | Status: AC
  Administered 2017-05-18: 30 mL via ORAL
  Filled 2017-05-18: qty 30

## 2017-05-18 NOTE — ED Notes (Signed)
Patient stated that she went to an urgent care last Friday and saw Dr. Clemens CatholicHennesy due to the pain.  She at some salad last Friday and it feels like something is stuck in her esophagus.  She also reports that she don't produce saliva.  She does have an appointment with Dr. Leone PayorGessner this coming Tuesday but she can not wait due to pain on her mid chest.

## 2017-05-18 NOTE — ED Provider Notes (Signed)
[facility] EMERGENCY DEPARTMENT Provider Note   CSN: [id number] Arrival date & time: 05/18/17  1506     History   Chief Complaint Chief Complaint  Patient presents with  . Chest Pain    HPI Donna Hicks is a 62 y.o. female with history of GERD, hypertension, palpitation, hypothyroidism and allergic rhinitis who presented with chest pain. Patient was on the way to the beach when she started having epigastric chest pain that she describes as something stuck her stomach and throat. She also felt sharp pain in her back at the same time.  She said this pain is different from her usual heartburn.  She also reports dysphagia with solid food. Patient reports having chronic cough and intermittnet choking sensation for over 3 months.  She says cough is dry without any hemoptysis.  She is on PPI for GERD.  She was seen at urgent care about a week ago for heartburn.  She says she was given a cocktail which did not help.  She says her chest x-ray was normal.  She was recommended to get a CT scan and follow-up with gastroenterologist.  She says she tried to call the gastroenterologist office but could not get an appointment sooner.  She also reports exertional dyspnea for the last 2 weeks. She denies fever, recent URI symptoms, emesis, diarrhea, blood in stool, melena or dysuria.  She admits nausea.  She also reports history of postnasal dripping.  She also feels dryness in her chart.  She has been using epinephrine spray over-the-counter for the last 1 months. Patient had EGD about 10 years ago which showed erythema in the distal esophagus consistent with GERD.  Her colonoscopy at the same time was normal.  HPI  Past Medical History:  Diagnosis Date  . Abdominal pain, other specified site   . Asthma   . GERD (gastroesophageal reflux disease)   . Hashimoto's thyroiditis   . Hx of Hashimoto thyroiditis   . Hyperactive airway disease   . Palpitations   . Perennial allergic rhinitis       Patient Active Problem List   Diagnosis Date Noted  . PALPITATIONS 01/28/2010  . ABDOMINAL PAIN RIGHT LOWER QUADRANT 03/21/2008  . GERD 01/22/2008  . ABDOMINAL PAIN, UPPER 01/22/2008  . HASHIMOTO'S THYROIDITIS 01/18/2008  . ALLERGIC RHINITIS, SEASONAL 01/18/2008  . ASTHMA 01/18/2008    Past Surgical History:  Procedure Laterality Date  . abdominal pain     right lower lobe quadrant  . allergic rhinitis    . appendectomy    . special screening for malignanrt neoplasms colon       OB History   None      Home Medications    Prior to Admission medications   Medication Sig Start Date End Date Taking? Authorizing Provider  Calcium Carbonate-Vitamin D (CALCIUM + D PO) Take by mouth.   Yes [provider]  levothyroxine (SYNTHROID, LEVOTHROID) 112 MCG tablet Take 112 mcg by mouth daily.   Yes [provider]  Loratadine (CLARITIN) 10 MG CAPS Take by mouth.   Yes [provider]  metoprolol (LOPRESSOR) 50 MG tablet Take 50 mg by mouth daily.   Yes [provider]  Multiple Vitamins-Minerals (MULTIVITAMIN PO) Take by mouth.   Yes [provider]  RABEprazole (ACIPHEX) 20 MG tablet Take 1 tablet (20 mg total) by mouth daily. 08/02/10 05/18/17 Yes Iva Boop, MD  vitamin E 400 UNIT capsule Take 400 Units by mouth daily.   Yes  [provider]    Family History Family History  Problem Relation Age of Onset  . Breast cancer Unknown        mother sister  . Diabetes Mother   . Diabetes Brother   . Liver disease Brother   . Coronary artery disease Father   . Alcohol abuse Brother   . Stroke Unknown   . Hyperlipidemia Brother   . Hypertension Brother   . Thyroid disease Brother     Social History Social History   Tobacco Use  . Smoking status: Never Smoker  . Smokeless tobacco: Never Used  Substance Use Topics  . Alcohol use: Not Currently  . Drug use: Never     Allergies   Ampicillin   Review of  Systems Review of Systems  Constitutional: Negative for chills and fever.  HENT: Positive for postnasal drip. Negative for congestion, ear pain, sore throat and voice change.   Eyes: Negative for pain and visual disturbance.  Respiratory: Positive for cough. Negative for shortness of breath and wheezing.   Cardiovascular: Positive for chest pain. Negative for palpitations and leg swelling.  Gastrointestinal: Positive for nausea. Negative for abdominal pain, blood in stool, constipation, diarrhea and vomiting.  Genitourinary: Negative for dysuria, hematuria and urgency.  Musculoskeletal: Negative for back pain and neck pain.  Skin: Negative for color change and rash.  Neurological: Negative for seizures and syncope.  Psychiatric/Behavioral: Negative for dysphoric mood.  All other systems reviewed and are negative.  Physical Exam Updated Vital Signs BP (!) 146/80 (BP Location: Right Arm)   Pulse 88   Temp 98.1 F (36.7 C) (Oral)   Resp 16   Ht  (1.549 m)   Wt 62.6 kg (138 lb)   LMP  (Exact Date)   SpO2 100%   BMI 26.07 kg/m    Physical Exam GEN: appears anxious. No apparent distress. Head: normocephalic and atraumatic  Eyes: conjunctiva without injection. Sclera anicteric. Oropharynx: MMM. No erythema. No exudation or petechiae.  Uvula midline HEM: negative for cervical or periauricular lymphadenopathies CVS: RRR, nl s1 & s2, no murmurs, no edema,  2+ DP pulses bilaterally, cap refills brisk RESP: no IWOB, good air movement bilaterally, CTAB GI: BS present & normal, soft, tenderness to palpation across upper abdomen, no guarding, no rebound, no mass GU: no suprapubic or CVA tenderness MSK: no focal tenderness or notable swelling SKIN: no apparent skin lesion ENDO: negative thyromegally NEURO: alert and oiented appropriately, no gross deficits    ED Treatments / Results  Labs (all labs ordered are listed, but only abnormal results are displayed) Labs Reviewed   TROPONIN I  CBC WITH DIFFERENTIAL/PLATELET  LIPASE, BLOOD  COMPREHENSIVE METABOLIC PANEL    EKG None  Radiology No results found.  Procedures Procedures (including critical care time)  Medications Ordered in ED Medications  gi cocktail (Maalox,Lidocaine,Donnatal) (has no administration in time range)     Initial Impression / Assessment and Plan / ED Course  I have reviewed the triage vital signs and the nursing notes.  Pertinent labs & imaging results that were available during my care of the patient were reviewed by me and considered in my medical decision making (see chart for details).  62 year old female with history of hypothyroidism, palpitation, GERD and allergic rhinitis who presents with atypical chest pain and dysphagia.  Pain likely due to GERD given history of this and associated cough but cannot rule out ACS.  She may have some esophagitis or hiatal hernia.  She is  tender to palpation across upper abdomen concerning for gastric/duodenal ulcer. Cardiopulmonary exam within normal limits.  Initial EKG and portable CXR were normal.  Low suspicion for PE with Wells score of 0.  Presentation not consistent with infectious etiology.  Will check troponin, BNP, CMP, CBC with differential and lipase.   Troponin normal x2.  BNP, CMP, CBC with differential and lipase within normal.  Patient reports brief improvement in her pain after GI cocktail.  Patient has an upcoming appointment with gastroenterology in 1 week.  Also discussed patient with GI on-call (Dr. Leone Payor) who will work her in for EGD next week.  At this point, patient comfortable going home and following up with gastroenterologist.  Discussed return precautions. Final Clinical Impressions(s) / ED Diagnoses   Final diagnoses:  Chest pain    ED Discharge Orders    None      Almon Hercules, MD 05/18/17 1846  Maia Plan, MD 05/19/17 1220

## 2017-05-18 NOTE — ED Notes (Signed)
ED Provider at bedside. 

## 2017-05-18 NOTE — Discharge Instructions (Addendum)
It is nice meeting you today!  We think your symptoms are related to reflux.  Your EKG and troponin are negative.  We do not believe this is related to your heart or your lung.  We talked to your gastroenterologist.  They will get in touch with you to expedite your endoscopy.  Please return to ED if you have worsening of your pain no other symptoms concerning to you.

## 2017-05-18 NOTE — Telephone Encounter (Signed)
[facility]/ dysphagia and epigastricpain  Has appt 4/2 w/ us  We will call her tomorrow and try to arrange EGD and possible dilation and likely cancel 4/2 OV

## 2017-05-18 NOTE — ED Triage Notes (Signed)
Pt has epigastric pain. Was on the way to the beach and began to have chest pain. It feels as if something is stuck in her esophagus and wont come up. She also states that she is having a hard time producing saliva.

## 2017-05-19 ENCOUNTER — Ambulatory Visit (AMBULATORY_SURGERY_CENTER): Payer: Self-pay

## 2017-05-19 VITALS — Ht 61.0 in | Wt 137.4 lb

## 2017-05-19 DIAGNOSIS — R1319 Other dysphagia: Secondary | ICD-10-CM

## 2017-05-19 DIAGNOSIS — R1310 Dysphagia, unspecified: Secondary | ICD-10-CM

## 2017-05-19 NOTE — Telephone Encounter (Signed)
Will call today and see if she can do EGD 4/1 0730

## 2017-05-19 NOTE — Telephone Encounter (Signed)
Spoke with pt, she will have previsit today at 3:30pm and EGD 05/22/17 at 7:30am in the [facility].

## 2017-05-19 NOTE — Telephone Encounter (Signed)
Thanks

## 2017-05-19 NOTE — Progress Notes (Signed)
Per pt, no allergies to soy or egg products.Pt not taking any weight loss meds or using  O2 at home.  Pt refused emmi video. 

## 2017-05-22 ENCOUNTER — Other Ambulatory Visit: Payer: Self-pay

## 2017-05-22 ENCOUNTER — Encounter: Payer: Self-pay | Admitting: Internal Medicine

## 2017-05-22 ENCOUNTER — Ambulatory Visit (AMBULATORY_SURGERY_CENTER): Payer: BLUE CROSS/BLUE SHIELD | Admitting: Internal Medicine

## 2017-05-22 VITALS — BP 126/63 | HR 69 | Temp 98.7°F | Resp 12 | Ht 61.0 in | Wt 137.0 lb

## 2017-05-22 DIAGNOSIS — K2990 Gastroduodenitis, unspecified, without bleeding: Secondary | ICD-10-CM

## 2017-05-22 DIAGNOSIS — R1310 Dysphagia, unspecified: Secondary | ICD-10-CM

## 2017-05-22 DIAGNOSIS — K317 Polyp of stomach and duodenum: Secondary | ICD-10-CM | POA: Diagnosis not present

## 2017-05-22 DIAGNOSIS — K2970 Gastritis, unspecified, without bleeding: Secondary | ICD-10-CM | POA: Diagnosis not present

## 2017-05-22 DIAGNOSIS — K2950 Unspecified chronic gastritis without bleeding: Secondary | ICD-10-CM | POA: Diagnosis not present

## 2017-05-22 MED ORDER — SODIUM CHLORIDE 0.9 % IV SOLN: 500.0000 mL | Freq: Once | INTRAVENOUS | Status: DC

## 2017-05-22 NOTE — Op Note (Signed)
[facility] Patient Name: Donna Hicks Procedure Date: 05/22/2017 7:21 AM MRN: [id number] Endoscopist: Iva Boop , MD Age: 62 Referring MD:  Date of Birth: 12-14-1955 Gender: Female Account #: [id number] Procedure:                Upper GI endoscopy Indications:              Dysphagia, Odynophagia, Heartburn, Chronic cough Medicines:                Propofol per Anesthesia, Monitored Anesthesia Care Procedure:                Pre-Anesthesia Assessment:                           - Prior to the procedure, a History and Physical                            was performed, and patient medications and                            allergies were reviewed. The patient's tolerance of                            previous anesthesia was also reviewed. The risks                            and benefits of the procedure and the sedation                            options and risks were discussed with the patient.                            All questions were answered, and informed consent                            was obtained. Prior Anticoagulants: The patient has                            taken no previous anticoagulant or antiplatelet                            agents. ASA Grade Assessment: II - A patient with                            mild systemic disease. After reviewing the risks                            and benefits, the patient was deemed in                            satisfactory condition to undergo the procedure.                           After obtaining informed consent, the endoscope was  passed under direct vision. Throughout the                            procedure, the patient's blood pressure, pulse, and                            oxygen saturations were monitored continuously. The                            Endoscope was introduced through the mouth, and                            advanced to the second part of duodenum. The upper                    GI endoscopy was accomplished without difficulty.                            The patient tolerated the procedure well. Scope In: Scope Out: Findings:                 Possible esophagitis with no bleeding was found at                            the gastroesophageal junction. Erosion and nodular.                            Biopsies were taken with a cold forceps for                            histology. Verification of patient identification                            for the specimen was done. Estimated blood loss was                            minimal.                           Multiple diminutive semi-sessile polyps with no                            stigmata of recent bleeding were found in the                            cardia, in the gastric fundus and in the gastric                            body. Biopsies were taken with a cold forceps for                            histology. Verification of patient identification                            for the specimen was done. Estimated blood loss  was                            minimal.                           Patchy mild inflammation characterized by erosions,                            erythema and friability was found at the pylorus.                            Biopsies were taken with a cold forceps for                            histology. Verification of patient identification                            for the specimen was done. Estimated blood loss was                            minimal.                           The exam was otherwise without abnormality.                           The scope was withdrawn. Dilation was performed in                            the entire esophagus with a Maloney dilator with                            mild resistance at 54 Fr. Estimated blood loss:                            none. Complications:            No immediate complications. Estimated Blood Loss:     Estimated blood loss  was minimal. Estimated blood                            loss was minimal. Impression:               - Possible esophagitis. Biopsied.                           - Multiple gastric polyps. Biopsied.                           - Gastritis. Biopsied.                           - The examination was otherwise normal. Recommendation:           - Patient has a contact number available for  emergencies. The signs and symptoms of potential                            delayed complications were discussed with the                            patient. Return to normal activities tomorrow.                            Written discharge instructions were provided to the                            patient.                           - Clear liquids x 1 hour then soft foods rest of                            day. Start prior diet tomorrow.                           - Continue present medications.                           - Await pathology results.                           - Once path reviewed will see what effect dilation                            has and consider change in Tx to help cogh,                            heartburn and odyno+dysphagia Iva Booparl E Gessner, MD 05/22/2017 7:59:00 AM This report has been signed electronically.

## 2017-05-22 NOTE — Progress Notes (Signed)
Called to room to assist during endoscopic procedure.  Patient ID and intended procedure confirmed with present staff. Received instructions for my participation in the procedure from the performing physician.  

## 2017-05-22 NOTE — Progress Notes (Signed)
I have reviewed the patient's medical history in detail and updated the computerized patient record.

## 2017-05-22 NOTE — Patient Instructions (Addendum)
There were some minor abnormalities - not sure if linked to your symptoms.  Slight inflammation where esophagus and stomach meet, gastric polyps and mild stomach inflammation. I took biopsies of all and also dilated the esophagus.  I will contact you with results and plans.  I appreciate the opportunity to care for you. Iva Booparl E. Tahisha Hakim, MD, FACG  YOU HAD AN ENDOSCOPIC PROCEDURE TODAY AT THE [facility]:   Refer to the procedure report that was given to you for any specific questions about what was found during the examination.  If the procedure report does not answer your questions, please call your gastroenterologist to clarify.  If you requested that your care partner not be given the details of your procedure findings, then the procedure report has been included in a sealed envelope for you to review at your convenience later.  YOU SHOULD EXPECT: Some feelings of bloating in the abdomen. Passage of more gas than usual.  Walking can help get rid of the air that was put into your GI tract during the procedure and reduce the bloating. If you had a lower endoscopy (such as a colonoscopy or flexible sigmoidoscopy) you may notice spotting of blood in your stool or on the toilet paper. If you underwent a bowel prep for your procedure, you may not have a normal bowel movement for a few days.  Please Note:  You might notice some irritation and congestion in your nose or some drainage.  This is from the oxygen used during your procedure.  There is no need for concern and it should clear up in a day or so.  SYMPTOMS TO REPORT IMMEDIATELY:   Following upper endoscopy (EGD)  Vomiting of blood or coffee ground material  New chest pain or pain under the shoulder blades  Painful or persistently difficult swallowing  New shortness of breath  Fever of 100F or higher  Black, tarry-looking stools  For urgent or emergent issues, a gastroenterologist can be reached at any hour by  calling [phone].   DIET:  Follow a post-dilation diet, clear liquids for 1 hour beginning at 0900, then follow a soft diet for the rest of the day today beginning at 1000, but then you may proceed to your regular diet as tolerated tomorrow. See post-dilation diet instructions given to you by your recovery nurse. Drink plenty of fluids but you should avoid alcoholic beverages for 24 hours.  MEDICATIONS: Continue present medications.  Please see handouts given to you by your recovery nurse.  ACTIVITY:  You should plan to take it easy for the rest of today and you should NOT DRIVE or use heavy machinery until tomorrow (because of the sedation medicines used during the test).    FOLLOW UP: Our staff will call the number listed on your records the next business day following your procedure to check on you and address any questions or concerns that you may have regarding the information given to you following your procedure. If we do not reach you, we will leave a message.  However, if you are feeling well and you are not experiencing any problems, there is no need to return our call.  We will assume that you have returned to your regular daily activities without incident.  If any biopsies were taken you will be contacted by phone or by letter within the next 1-3 weeks.  Please call us at [phone] if you have not heard about the biopsies in 3 weeks.  Thank you for allowing [place] to provide for your healthcare needs today.   SIGNATURES/CONFIDENTIALITY: You and/or your care partner have signed paperwork which will be entered into your electronic medical record.  These signatures attest to the fact that that the information above on your After Visit Summary has been reviewed and is understood.  Full responsibility of the confidentiality of this discharge information lies with you and/or your care-partner.

## 2017-05-22 NOTE — Progress Notes (Signed)
Report given to PACU, vss 

## 2017-05-23 ENCOUNTER — Telehealth: Payer: Self-pay

## 2017-05-23 ENCOUNTER — Ambulatory Visit: Payer: BLUE CROSS/BLUE SHIELD | Admitting: Physician Assistant

## 2017-05-23 NOTE — Telephone Encounter (Signed)
  Follow up Call-  Call back number 05/22/2017  Post procedure Call Back phone  # [phone]  Permission to leave phone message Yes  Some recent data might be hidden     Left message

## 2017-05-23 NOTE — Telephone Encounter (Signed)
  Follow up Call-  Call back number 05/22/2017  Post procedure Call Back phone  # [phone]  Permission to leave phone message Yes  Some recent data might be hidden     Patient questions:  Do you have a fever, pain , or abdominal swelling? No. Pain Score  0 *  Have you tolerated food without any problems? Yes.    Have you been able to return to your normal activities? Yes.    Do you have any questions about your discharge instructions: Diet   No. Medications  No. Follow up visit  No.  Do you have questions or concerns about your Care? No.  Actions: * If pain score is 4 or above: No action needed, pain <4.

## 2017-05-25 NOTE — Progress Notes (Signed)
Biopsies all benign - no significant inflammation  Please call from office and get a symptom update after esophageal dilation and let me know  [facility] no letter/recall

## 2017-05-26 ENCOUNTER — Other Ambulatory Visit: Payer: Self-pay

## 2017-05-26 DIAGNOSIS — R1310 Dysphagia, unspecified: Secondary | ICD-10-CM

## 2017-05-26 NOTE — Progress Notes (Signed)
Please arrange a modified barium swallow re: dyspgafia, cough ? aspiration

## 2017-06-02 ENCOUNTER — Ambulatory Visit: Payer: BLUE CROSS/BLUE SHIELD | Admitting: Internal Medicine

## 2017-06-09 ENCOUNTER — Other Ambulatory Visit (HOSPITAL_COMMUNITY): Payer: Self-pay | Admitting: Internal Medicine

## 2017-06-09 DIAGNOSIS — R1310 Dysphagia, unspecified: Secondary | ICD-10-CM

## 2017-06-13 ENCOUNTER — Ambulatory Visit (HOSPITAL_COMMUNITY)
Admission: RE | Admit: 2017-06-13 | Discharge: 2017-06-13 | Disposition: A | Discharge status: Home / Self Care | Payer: BLUE CROSS/BLUE SHIELD | Source: Ambulatory Visit | Attending: Internal Medicine | Admitting: Internal Medicine

## 2017-06-13 DIAGNOSIS — R0982 Postnasal drip: Secondary | ICD-10-CM | POA: Diagnosis not present

## 2017-06-13 DIAGNOSIS — R1310 Dysphagia, unspecified: Secondary | ICD-10-CM

## 2017-06-13 DIAGNOSIS — E039 Hypothyroidism, unspecified: Secondary | ICD-10-CM | POA: Diagnosis not present

## 2017-06-13 DIAGNOSIS — J029 Acute pharyngitis, unspecified: Secondary | ICD-10-CM | POA: Diagnosis not present

## 2017-06-13 DIAGNOSIS — K219 Gastro-esophageal reflux disease without esophagitis: Secondary | ICD-10-CM | POA: Diagnosis not present

## 2017-06-14 NOTE — Progress Notes (Signed)
Please ask her if the changes recommended are helping her sxs and if so how much?

## 2017-06-18 NOTE — Progress Notes (Signed)
Nored.

## 2017-08-21 DIAGNOSIS — E039 Hypothyroidism, unspecified: Secondary | ICD-10-CM | POA: Diagnosis not present

## 2017-08-21 DIAGNOSIS — R5383 Other fatigue: Secondary | ICD-10-CM | POA: Diagnosis not present

## 2017-08-21 DIAGNOSIS — R634 Abnormal weight loss: Secondary | ICD-10-CM | POA: Diagnosis not present

## 2017-08-21 DIAGNOSIS — Z79899 Other long term (current) drug therapy: Secondary | ICD-10-CM | POA: Diagnosis not present

## 2017-08-21 DIAGNOSIS — E559 Vitamin D deficiency, unspecified: Secondary | ICD-10-CM | POA: Diagnosis not present

## 2017-08-21 DIAGNOSIS — R42 Dizziness and giddiness: Secondary | ICD-10-CM | POA: Diagnosis not present

## 2017-09-26 DIAGNOSIS — E538 Deficiency of other specified B group vitamins: Secondary | ICD-10-CM | POA: Diagnosis not present

## 2017-09-26 DIAGNOSIS — J0100 Acute maxillary sinusitis, unspecified: Secondary | ICD-10-CM | POA: Diagnosis not present

## 2017-09-26 DIAGNOSIS — G4700 Insomnia, unspecified: Secondary | ICD-10-CM | POA: Diagnosis not present

## 2017-10-26 DIAGNOSIS — E039 Hypothyroidism, unspecified: Secondary | ICD-10-CM | POA: Diagnosis not present

## 2017-10-26 DIAGNOSIS — Z23 Encounter for immunization: Secondary | ICD-10-CM | POA: Diagnosis not present

## 2017-10-26 DIAGNOSIS — E538 Deficiency of other specified B group vitamins: Secondary | ICD-10-CM | POA: Diagnosis not present

## 2018-05-02 ENCOUNTER — Encounter: Payer: Self-pay | Admitting: Internal Medicine

## 2020-03-10 IMAGING — RF DG SWALLOWING FUNCTION - NRPT [facility]
14 series · 24 of 24 positions shown · non-contrast
Comparison: none

[Series 1: cp_standard · 0.34mm/px · 2 of 74 frames shown (1 of 14)]
[frame 12/74]
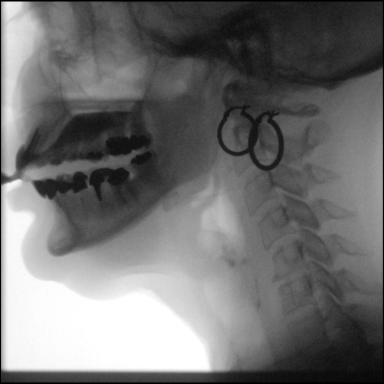
[frame 63/74]
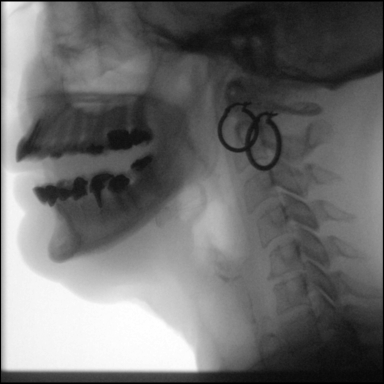

[Series 2: cp_standard · 0.35mm/px · 2 of 59 frames shown (2 of 14)]
[frame 30/59]
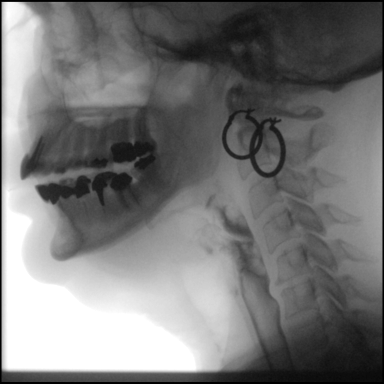
[frame 54/59]
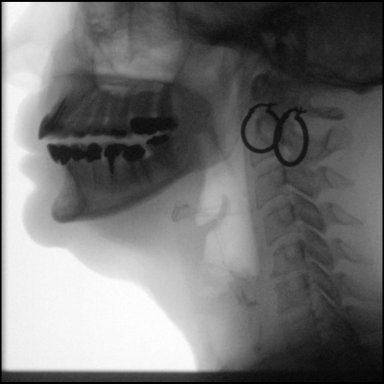

[Series 3: cp_standard · 0.35mm/px · 1 of 21 frames shown (3 of 14)]
[frame 11/21]
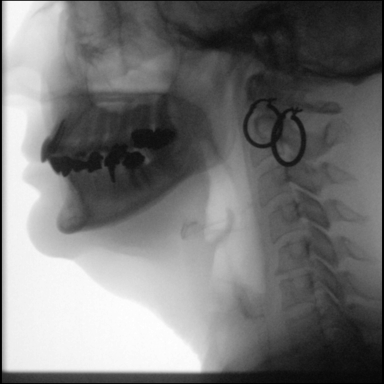

[Series 4: cp_standard · 0.35mm/px · 2 of 82 frames shown (4 of 14)]
[frame 13/82]
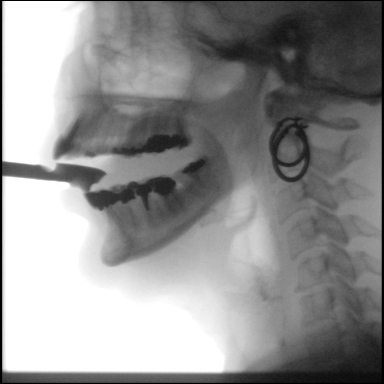
[frame 70/82]
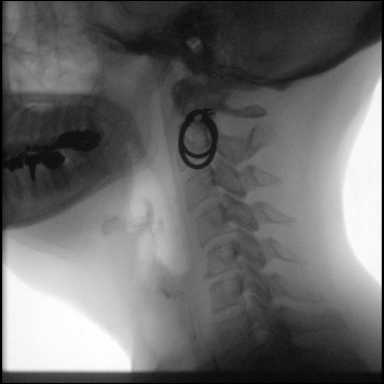

[Series 5: cp_standard · 0.35mm/px · 2 of 77 frames shown (5 of 14)]
[frame 21/77]
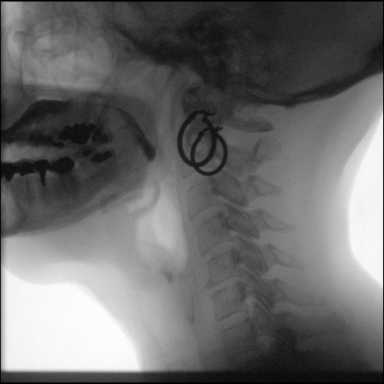
[frame 66/77]
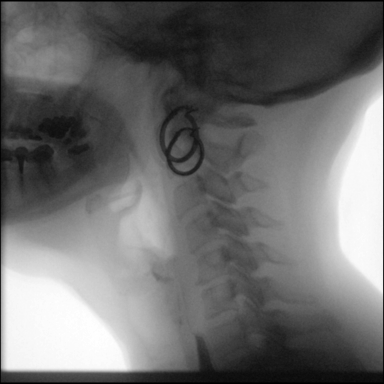

[Series 6: cp_standard · 0.35mm/px · 1 of 88 frames shown (6 of 14)]
[frame 75/88]
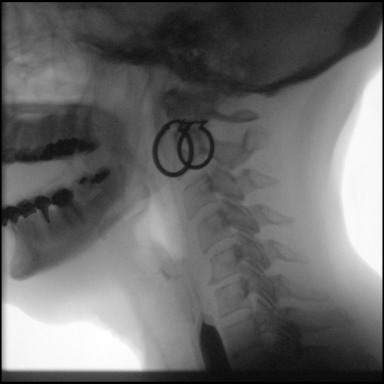

[Series 7: cp_standard · 0.35mm/px · 2 of 104 frames shown (7 of 14)]
[frame 16/104]
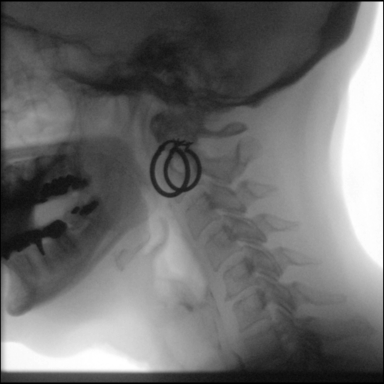
[frame 53/104]
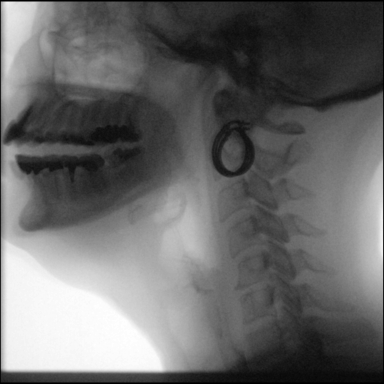

[Series 8: cp_standard · 0.35mm/px · 2 of 44 frames shown (8 of 14)]
[frame 23/44]
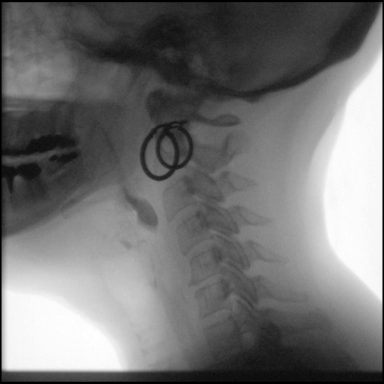
[frame 38/44]
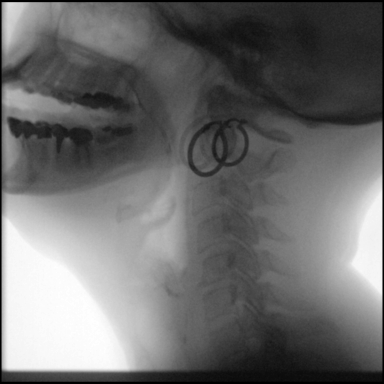

[Series 9: cp_standard · 0.35mm/px · 1 of 59 frames shown (9 of 14)]
[frame 9/59]
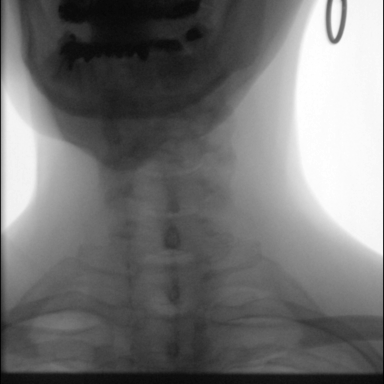

[Series 10: cp_standard · 0.35mm/px · 2 of 43 frames shown (10 of 14)]
[frame 3/43]
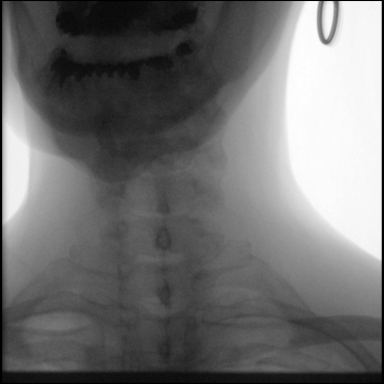
[frame 22/43]
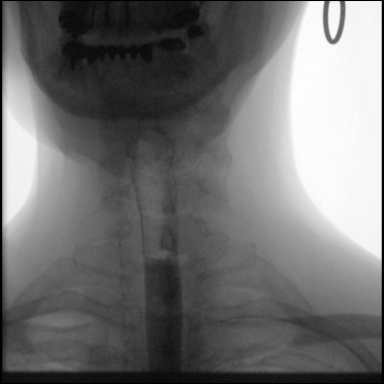

[Series 11: cp_standard · 0.35mm/px · 2 of 103 frames shown (11 of 14)]
[frame 52/103]
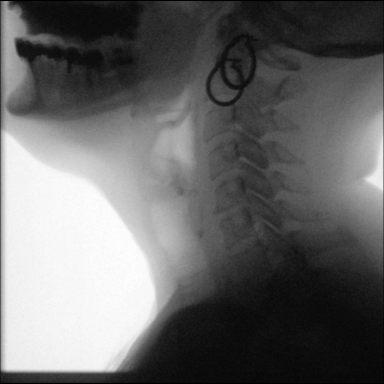
[frame 88/103]
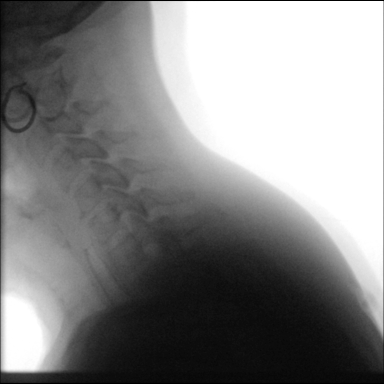

[Series 12: cp_standard · 0.35mm/px · 1 of 89 frames shown (12 of 14)]
[frame 33/89]
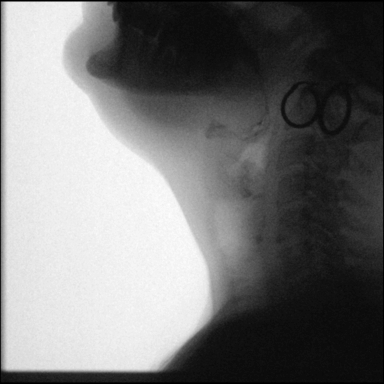

[Series 13: cp_standard · 0.34mm/px · 2 of 107 frames shown (13 of 14)]
[frame 17/107]
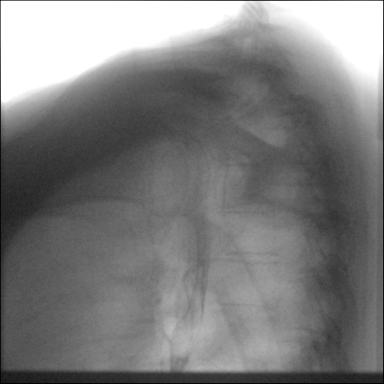
[frame 54/107]
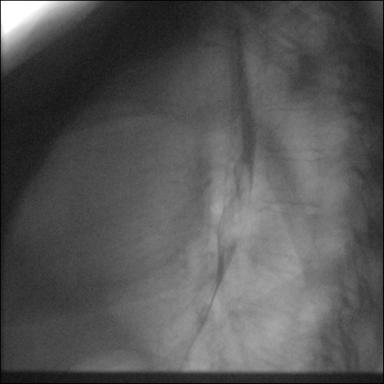

[Series 14: cp_standard · 0.34mm/px · 2 of 262 frames shown (14 of 14)]
[frame 132/262]
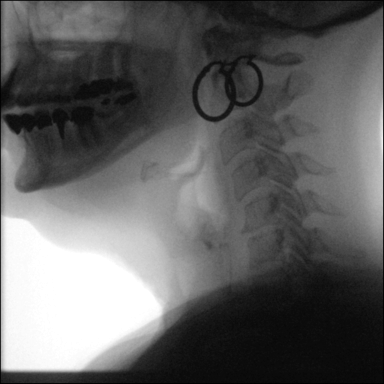
[frame 223/262]
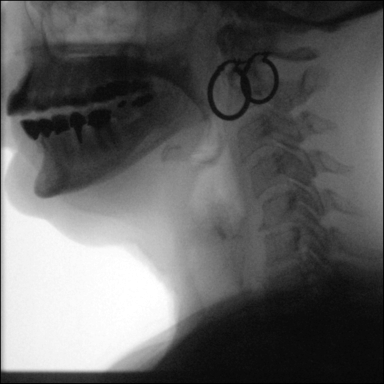

[24 of 24 positions shown; findings below may reference images not displayed]

FLUOROSCOPY FOR SWALLOWING FUNCTION STUDY:
Fluoroscopy was provided for swallowing function study, which was administered by a speech pathologist.  Final results and recommendations from this study are contained within the speech pathology report.

## 2022-01-03 DIAGNOSIS — E7800 Pure hypercholesterolemia, unspecified: Secondary | ICD-10-CM | POA: Diagnosis not present

## 2022-01-03 DIAGNOSIS — E039 Hypothyroidism, unspecified: Secondary | ICD-10-CM | POA: Diagnosis not present

## 2022-01-03 DIAGNOSIS — R000 Tachycardia, unspecified: Secondary | ICD-10-CM | POA: Diagnosis not present

## 2022-01-03 DIAGNOSIS — K219 Gastro-esophageal reflux disease without esophagitis: Secondary | ICD-10-CM | POA: Diagnosis not present

## 2022-01-03 DIAGNOSIS — G4700 Insomnia, unspecified: Secondary | ICD-10-CM | POA: Diagnosis not present

## 2022-03-10 ENCOUNTER — Encounter: Payer: Self-pay | Admitting: Gastroenterology

## 2022-04-04 ENCOUNTER — Encounter: Payer: Self-pay | Admitting: Gastroenterology

## 2022-04-04 ENCOUNTER — Ambulatory Visit: Payer: Medicare HMO | Admitting: Gastroenterology

## 2022-04-04 VITALS — BP 108/64 | HR 73 | Ht 61.0 in | Wt 143.0 lb

## 2022-04-04 DIAGNOSIS — Z1212 Encounter for screening for malignant neoplasm of rectum: Secondary | ICD-10-CM | POA: Insufficient documentation

## 2022-04-04 DIAGNOSIS — R109 Unspecified abdominal pain: Secondary | ICD-10-CM | POA: Insufficient documentation

## 2022-04-04 DIAGNOSIS — R142 Eructation: Secondary | ICD-10-CM | POA: Insufficient documentation

## 2022-04-04 DIAGNOSIS — K219 Gastro-esophageal reflux disease without esophagitis: Secondary | ICD-10-CM

## 2022-04-04 NOTE — Patient Instructions (Signed)
_______________________________________________________  If your blood pressure at your visit was 140/90 or greater, please contact your primary care physician to follow up on this.  _______________________________________________________  If you are age 66 or older, your body mass index should be between 23-30. Your Body mass index is 27.02 kg/m. If this is out of the aforementioned range listed, please consider follow up with your Primary Care Provider.  If you are age 45 or younger, your body mass index should be between 19-25. Your Body mass index is 27.02 kg/m. If this is out of the aformentioned range listed, please consider follow up with your Primary Care Provider.   ________________________________________________________  The [facility] providers would like to encourage you to use [facility] to communicate with providers for non-urgent requests or questions.  Due to long hold times on the telephone, sending your provider a message by [facility] may be a faster and more efficient way to get a response.  Please allow 48 business hours for a response.  Please remember that this is for non-urgent requests.  _______________________________________________________  Donna Hicks have been scheduled for a colonoscopy. Please follow written instructions given to you at your visit today.  Please pick up your prep supplies at the pharmacy within the next 1-3 days. If you use inhalers (even only as needed), please bring them with you on the day of your procedure.

## 2022-04-04 NOTE — Progress Notes (Addendum)
04/18/2022 Donna Hicks [id number] 10-05-1955   HISTORY OF PRESENT ILLNESS: This is a 67 year old female who is a patient Dr. Celesta Aver.  She is here today to discuss colonoscopy.  Her last colonoscopy was in January 2010 at which time study was unremarkable.  She tells me that around Christmas time she dealt with some constipation, was not sure if it was diet related, but had some issues with that for several days.  Eventually it straightened out and since then she has been moving her bowels just fine.  She does report some daily lower abdominal and low back pain.  Has been using acetaminophen for the low back pain as it is a daily constant thing.  She is also seeing her GYN next week to evaluate that.  She denies any rectal bleeding.  She takes pantoprazole 40 mg daily for acid reflux related issues.  On occasion she will have some belching and indigestion type symptoms like when she eats spaghetti, etc.  EGD in April 2019 with gastritis and gastric fundic gland polyps.   Past Medical History:  Diagnosis Date   Abdominal pain, other specified site    epigastric pain   Asthma    on inhaler   Back pain    radiates from front chest to middle of back on left side   Chest pain    epigastric pain   Choking    with burning   Cough    on and off since Jan 2019   Dry mouth    at times   Dysphagia    GERD (gastroesophageal reflux disease)    Hashimoto's thyroiditis    Hx of Hashimoto thyroiditis    Hyperactive airway disease    Hyperlipidemia    no meds   Nausea    Palpitations    due to cough and choking/ EMS called on 04/06/17/EKG normal   Perennial allergic rhinitis    Post-nasal drip    on and off/    SOB (shortness of breath) on exertion    Sore throat    with raw feeling in back   Thyroid disease    Weight loss    since Feb 2019   Past Surgical History:  Procedure Laterality Date   abdominal pain     right lower lobe quadrant   allergic rhinitis      appendectomy     CHOLECYSTECTOMY     COLONOSCOPY     special screening for malignanrt neoplasms colon      reports that she has never smoked. She has never used smokeless tobacco. She reports that she does not currently use alcohol. She reports that she does not use drugs. family history includes Alcohol abuse in her brother; Breast cancer in her mother, sister, and another family member; Coronary artery disease in her father; Diabetes in her brother, brother, and mother; Liver disease in her brother; Parkinson's disease in her mother; Stroke in an other family member; Thyroid disease in her brother; Uterine cancer in her maternal aunt. Allergies  Allergen Reactions   Ampicillin     REACTION: Nausea      Outpatient Encounter Medications as of 04/04/2022  Medication Sig   Ascorbic Acid (VITAMIN C) 100 MG tablet Take 100 mg by mouth daily.   Calcium Carbonate-Vitamin D (CALCIUM + D PO) Take by mouth.   levothyroxine (SYNTHROID, LEVOTHROID) 112 MCG tablet Take 112 mcg by mouth daily. Synthroid 100 mcg alternating with 112 mcg daily   metoprolol (LOPRESSOR)  50 MG tablet Take 50 mg by mouth daily.   Multiple Vitamins-Minerals (MULTIVITAMIN PO) Take by mouth.   pantoprazole (PROTONIX) 40 MG tablet Take 40 mg by mouth daily.   vitamin B-12 (CYANOCOBALAMIN) 50 MCG tablet Take 50 mcg by mouth daily.   zolpidem (AMBIEN CR) 6.25 MG CR tablet Take by mouth.   ALBUTEROL SULFATE HFA IN Inhale into the lungs as needed. (Patient not taking: Reported on 04/04/2022)   Loratadine (CLARITIN) 10 MG CAPS Take by mouth. (Patient not taking: Reported on 04/04/2022)   vitamin E 400 UNIT capsule Take 400 Units by mouth daily. (Patient not taking: Reported on 04/04/2022)   [DISCONTINUED] RABEprazole (ACIPHEX) 20 MG tablet Take 1 tablet (20 mg total) by mouth daily.   [DISCONTINUED] 0.9 %  sodium chloride infusion    No facility-administered encounter medications on file as of 04/04/2022.     REVIEW OF SYSTEMS  :  All other systems reviewed and negative except where noted in the History of Present Illness.   PHYSICAL EXAM: BP 108/64   Pulse 73   Ht '5\' 1"'$  (1.549 m)   Wt 143 lb (64.9 kg)   BMI 27.02 kg/m  General: Well developed white female in no acute distress Head: Normocephalic and atraumatic Eyes:  Sclerae anicteric, conjunctiva pink. Ears: Normal auditory acuity Lungs: Clear throughout to auscultation; no W/R/R. Heart: Regular rate and rhythm; no M/R/G. Abdomen: Soft, non-distended.  BS present.  Mild lower abdominal TTP. Rectal:  Will be done at the time of colonoscopy. Musculoskeletal: Symmetrical with no gross deformities  Skin: No lesions on visible extremities Extremities: No edema  Neurological: Alert oriented x 4, grossly non-focal Psychological:  Alert and cooperative. Normal mood and affect  ASSESSMENT AND PLAN: *CRC screening:  Last colonoscopy was 2010.  Will schedule with Dr. Carlean Purl.  The risks, benefits, and alternatives to colonoscopy were discussed with the patient and she consents to proceed.  *Lower abdominal and low back pain:  ? If her abdominal pain is referred from her back.  Will obviously evaluate with colonoscopy and she is going to see her GYN next week.  If all of turns unremarkable could consider CT scan of the abdomen and pelvis with contrast. *GERD: Takes pantoprazole 40 mg daily.  Gets increased belching and some indigestion when she eats certain things such as spaghetti.  Can take Pepcid OTC as needed as well.  EGD in 2019 with gastritis and gastric fundic gland polyps.  CC:  Carol Ada, MD

## 2022-04-14 ENCOUNTER — Encounter: Payer: Self-pay | Admitting: Internal Medicine

## 2022-04-18 NOTE — Addendum Note (Signed)
Addended by: Gatha Mayer on: 04/18/2022 10:32 AM   Modules accepted: Orders, Level of Service

## 2022-04-21 ENCOUNTER — Encounter: Payer: Self-pay | Admitting: Internal Medicine

## 2022-04-21 ENCOUNTER — Ambulatory Visit (AMBULATORY_SURGERY_CENTER): Payer: Medicare HMO | Admitting: Internal Medicine

## 2022-04-21 VITALS — BP 119/64 | HR 78 | Temp 98.2°F | Resp 16 | Ht 61.0 in | Wt 143.0 lb

## 2022-04-21 DIAGNOSIS — Z1211 Encounter for screening for malignant neoplasm of colon: Secondary | ICD-10-CM

## 2022-04-21 DIAGNOSIS — Z1212 Encounter for screening for malignant neoplasm of rectum: Secondary | ICD-10-CM

## 2022-04-21 MED ORDER — SODIUM CHLORIDE 0.9 % IV SOLN: 500.0000 mL | Freq: Once | INTRAVENOUS | Status: DC

## 2022-04-21 NOTE — Progress Notes (Signed)
[facility] History and Physical   Primary Care Physician:  Carol Ada, MD   Reason for Procedure:   CRCA screening  Plan:    colonoscopy     HPI: Donna Hicks is a 67 y.o. female  for screening exam Negative exam 2010   Past Medical History:  Diagnosis Date   Abdominal pain, other specified site    epigastric pain   Asthma    on inhaler   Back pain    radiates from front chest to middle of back on left side   Chest pain    epigastric pain   Choking    with burning   Cough    on and off since Jan 2019   Dry mouth    at times   Dysphagia    GERD (gastroesophageal reflux disease)    Hashimoto's thyroiditis    Hx of Hashimoto thyroiditis    Hyperactive airway disease    Hyperlipidemia    no meds   Nausea    Palpitations    due to cough and choking/ EMS called on 04/06/17/EKG normal   Perennial allergic rhinitis    Post-nasal drip    on and off/    SOB (shortness of breath) on exertion    Sore throat    with raw feeling in back   Thyroid disease    Weight loss    since Feb 2019    Past Surgical History:  Procedure Laterality Date   abdominal pain     right lower lobe quadrant   allergic rhinitis     appendectomy     CHOLECYSTECTOMY     COLONOSCOPY     special screening for malignanrt neoplasms colon      Prior to Admission medications   Medication Sig Start Date End Date Taking? Authorizing Provider  Ascorbic Acid (VITAMIN C) 100 MG tablet Take 100 mg by mouth daily.   Yes [provider]  Calcium Carbonate-Vitamin D (CALCIUM + D PO) Take by mouth.   Yes [provider]  levothyroxine (SYNTHROID, LEVOTHROID) 112 MCG tablet Take 112 mcg by mouth daily. Synthroid 100 mcg alternating with 112 mcg daily   Yes [provider]  Loratadine (CLARITIN) 10 MG CAPS Take by mouth.   Yes [provider]  metoprolol (LOPRESSOR) 50 MG tablet Take 50 mg by mouth daily.   Yes [provider]  Multiple  Vitamins-Minerals (MULTIVITAMIN PO) Take by mouth.   Yes [provider]  pantoprazole (PROTONIX) 40 MG tablet Take 40 mg by mouth daily.   Yes [provider]  vitamin B-12 (CYANOCOBALAMIN) 50 MCG tablet Take 50 mcg by mouth daily.   Yes [provider]  zolpidem (AMBIEN CR) 6.25 MG CR tablet Take by mouth. 05/19/15  Yes [provider]  ALBUTEROL SULFATE HFA IN Inhale into the lungs as needed. Patient not taking: Reported on 04/04/2022    [provider]  vitamin E 400 UNIT capsule Take 400 Units by mouth daily. Patient not taking: Reported on 04/21/2022    [provider]    Current Outpatient Medications  Medication Sig Dispense Refill   Ascorbic Acid (VITAMIN C) 100 MG tablet Take 100 mg by mouth daily.     Calcium Carbonate-Vitamin D (CALCIUM + D PO) Take by mouth.     levothyroxine (SYNTHROID, LEVOTHROID) 112 MCG tablet Take 112 mcg by mouth daily. Synthroid 100 mcg alternating with 112 mcg daily     Loratadine (CLARITIN) 10 MG CAPS  Take by mouth.     metoprolol (LOPRESSOR) 50 MG tablet Take 50 mg by mouth daily.     Multiple Vitamins-Minerals (MULTIVITAMIN PO) Take by mouth.     pantoprazole (PROTONIX) 40 MG tablet Take 40 mg by mouth daily.     vitamin B-12 (CYANOCOBALAMIN) 50 MCG tablet Take 50 mcg by mouth daily.     zolpidem (AMBIEN CR) 6.25 MG CR tablet Take by mouth.     ALBUTEROL SULFATE HFA IN Inhale into the lungs as needed. (Patient not taking: Reported on 04/04/2022)     vitamin E 400 UNIT capsule Take 400 Units by mouth daily. (Patient not taking: Reported on 04/21/2022)     Current Facility-Administered Medications  Medication Dose Route Frequency Provider Last Rate Last Admin   0.9 %  sodium chloride infusion  500 mL Intravenous Once Gatha Mayer, MD        Allergies as of 04/21/2022 - Review Complete 04/21/2022  Allergen Reaction Noted   Ampicillin  01/18/2008    Family History  Problem Relation Age of Onset    Diabetes Mother    Parkinson's disease Mother    Breast cancer Mother    Coronary artery disease Father    Breast cancer Sister    Diabetes Brother    Liver disease Brother    Thyroid disease Brother    Alcohol abuse Brother    Diabetes Brother    Uterine cancer Maternal Aunt    Breast cancer Other        mother sister   Stroke Other    Colon cancer Neg Hx    Stomach cancer Neg Hx    Esophageal cancer Neg Hx     Social History   Socioeconomic History   Marital status: Married    Spouse name: Not on file   Number of children: 1   Years of education: Not on file   Highest education level: Not on file  Occupational History   Occupation: retired  Tobacco Use   Smoking status: Never   Smokeless tobacco: Never  Vaping Use   Vaping Use: Never used  Substance and Sexual Activity   Alcohol use: Not Currently   Drug use: Never   Sexual activity: Not on file  Other Topics Concern   Not on file  Social History Narrative   Not on file   Social Determinants of Health   Financial Resource Strain: Not on file  Food Insecurity: Not on file  Transportation Needs: Not on file  Physical Activity: Not on file  Stress: Not on file  Social Connections: Not on file  Intimate Partner Violence: Not on file    Review of Systems:  All other review of systems negative except as mentioned in the HPI.  Physical Exam: Vital signs BP 128/70   Pulse 88   Temp 98.2 F (36.8 C)   Resp 12   Ht '5\' 1"'$  (1.549 m)   Wt 143 lb (64.9 kg)   SpO2 100%   BMI 27.02 kg/m   General:   Alert,  Well-developed, well-nourished, pleasant and cooperative in NAD Lungs:  Clear throughout to auscultation.   Heart:  Regular rate and rhythm; no murmurs, clicks, rubs,  or gallops. Abdomen:  Soft, nontender and nondistended. Normal bowel sounds.   Neuro/Psych:  Alert and cooperative. Normal mood and affect. A and O x 3   '@Laquanda Bick'$  Simonne Maffucci, MD, [facility] [phone]  (pager) 04/21/2022 3:44 PM@

## 2022-04-21 NOTE — Patient Instructions (Addendum)
No polyps or cancer seen. Still have diverticulosis.  I do not think you need to repeat a routine colonoscopy though you could do another at 76 if desired.  I appreciate the opportunity to care for you. Gatha Mayer, MD, FACG    YOU HAD AN ENDOSCOPIC PROCEDURE TODAY AT [facility]:   Refer to the procedure report that was given to you for any specific questions about what was found during the examination.  If the procedure report does not answer your questions, please call your gastroenterologist to clarify.  If you requested that your care partner not be given the details of your procedure findings, then the procedure report has been included in a sealed envelope for you to review at your convenience later.  YOU SHOULD EXPECT: Some feelings of bloating in the abdomen. Passage of more gas than usual.  Walking can help get rid of the air that was put into your GI tract during the procedure and reduce the bloating. If you had a lower endoscopy (such as a colonoscopy or flexible sigmoidoscopy) you may notice spotting of blood in your stool or on the toilet paper. If you underwent a bowel prep for your procedure, you may not have a normal bowel movement for a few days.  Please Note:  You might notice some irritation and congestion in your nose or some drainage.  This is from the oxygen used during your procedure.  There is no need for concern and it should clear up in a day or so.  SYMPTOMS TO REPORT IMMEDIATELY:  Following lower endoscopy (colonoscopy or flexible sigmoidoscopy):  Excessive amounts of blood in the stool  Significant tenderness or worsening of abdominal pains  Swelling of the abdomen that is new, acute  Fever of 100F or higher  For urgent or emergent issues, a gastroenterologist can be reached at any hour by calling [phone]. Do not use MyChart messaging for urgent concerns.    DIET:  We do recommend a small meal at first, but then you may proceed to  your regular diet.  Drink plenty of fluids but you should avoid alcoholic beverages for 24 hours.  ACTIVITY:  You should plan to take it easy for the rest of today and you should NOT DRIVE or use heavy machinery until tomorrow (because of the sedation medicines used during the test).    FOLLOW UP: Our staff will call the number listed on your records the next business day following your procedure.  We will call around 7:15- 8:00 am to check on you and address any questions or concerns that you may have regarding the information given to you following your procedure. If we do not reach you, we will leave a message.     If any biopsies were taken you will be contacted by phone or by letter within the next 1-3 weeks.  Please call us at [phone] if you have not heard about the biopsies in 3 weeks.    SIGNATURES/CONFIDENTIALITY: You and/or your care partner have signed paperwork which will be entered into your electronic medical record.  These signatures attest to the fact that that the information above on your After Visit Summary has been reviewed and is understood.  Full responsibility of the confidentiality of this discharge information lies with you and/or your care-partner.

## 2022-04-21 NOTE — Op Note (Signed)
[facility] Patient Name: Donna Hicks Procedure Date: 04/21/2022 3:40 PM MRN: [id number] Endoscopist: Gatha Mayer , MD, [phone] Age: 67 Referring MD:  Date of Birth: 1956/01/21 Gender: Female Account #: [id number] Procedure:                Colonoscopy Indications:              Screening for colorectal malignant neoplasm, Last                            colonoscopy: 2010 Medicines:                Monitored Anesthesia Care Procedure:                Pre-Anesthesia Assessment:                           - Prior to the procedure, a History and Physical                            was performed, and patient medications and                            allergies were reviewed. The patient's tolerance of                            previous anesthesia was also reviewed. The risks                            and benefits of the procedure and the sedation                            options and risks were discussed with the patient.                            All questions were answered, and informed consent                            was obtained. Prior Anticoagulants: The patient has                            taken no anticoagulant or antiplatelet agents. ASA                            Grade Assessment: II - A patient with mild systemic                            disease. After reviewing the risks and benefits,                            the patient was deemed in satisfactory condition to                            undergo the procedure.  After obtaining informed consent, the colonoscope                            was passed under direct vision. Throughout the                            procedure, the patient's blood pressure, pulse, and                            oxygen saturations were monitored continuously. The                            Olympus [id number]-H190DL [id number]) Colonoscope was                            introduced through the anus and advanced  to the the                            cecum, identified by appendiceal orifice and                            ileocecal valve. The colonoscopy was performed                            without difficulty. The patient tolerated the                            procedure well. The quality of the bowel                            preparation was good. The ileocecal valve,                            appendiceal orifice, and rectum were photographed.                            The bowel preparation used was Miralax via split                            dose instruction. Scope In: 3:52:42 PM Scope Out: 4:05:41 PM Scope Withdrawal Time: 0 hours 9 minutes 45 seconds  Total Procedure Duration: 0 hours 12 minutes 59 seconds  Findings:                 The perianal and digital rectal examinations were                            normal.                           Multiple diverticula were found in the sigmoid                            colon.  The exam was otherwise without abnormality on                            direct and retroflexion views. Complications:            No immediate complications. Estimated Blood Loss:     Estimated blood loss: none. Impression:               - Moderate diverticulosis in the sigmoid colon.                           - The examination was otherwise normal on direct                            and retroflexion views.                           - No specimens collected. Recommendation:           - Patient has a contact number available for                            emergencies. The signs and symptoms of potential                            delayed complications were discussed with the                            patient. Return to normal activities tomorrow.                            Written discharge instructions were provided to the                            patient.                           - Resume previous diet.                           -  Continue present medications.                           - No recommendation at this time regarding repeat                            colonoscopy due to age. Patient porefers not to                            repeat in 10 yrs (will be 76+) Gatha Mayer, MD 04/21/2022 4:12:00 PM This report has been signed electronically.

## 2022-04-21 NOTE — Progress Notes (Signed)
Uneventful anesthetic. Report to pacu rn. Vss. Care resumed by rn.

## 2022-04-22 ENCOUNTER — Telehealth: Payer: Self-pay | Admitting: *Deleted

## 2022-04-22 NOTE — Telephone Encounter (Signed)
No answer on follow up call. Left message.   

## 2022-05-04 ENCOUNTER — Encounter: Payer: Medicare HMO | Admitting: Internal Medicine

## 2022-06-16 DIAGNOSIS — Z01411 Encounter for gynecological examination (general) (routine) with abnormal findings: Secondary | ICD-10-CM | POA: Diagnosis not present

## 2022-06-16 DIAGNOSIS — Z1231 Encounter for screening mammogram for malignant neoplasm of breast: Secondary | ICD-10-CM | POA: Diagnosis not present

## 2022-06-16 DIAGNOSIS — Z01419 Encounter for gynecological examination (general) (routine) without abnormal findings: Secondary | ICD-10-CM | POA: Diagnosis not present

## 2022-06-16 DIAGNOSIS — N952 Postmenopausal atrophic vaginitis: Secondary | ICD-10-CM | POA: Diagnosis not present

## 2022-06-16 DIAGNOSIS — Z124 Encounter for screening for malignant neoplasm of cervix: Secondary | ICD-10-CM | POA: Diagnosis not present

## 2022-06-16 DIAGNOSIS — M545 Low back pain, unspecified: Secondary | ICD-10-CM | POA: Diagnosis not present

## 2022-06-16 DIAGNOSIS — Z6826 Body mass index (BMI) 26.0-26.9, adult: Secondary | ICD-10-CM | POA: Diagnosis not present

## 2022-06-20 DIAGNOSIS — E039 Hypothyroidism, unspecified: Secondary | ICD-10-CM | POA: Diagnosis not present

## 2022-06-20 DIAGNOSIS — K219 Gastro-esophageal reflux disease without esophagitis: Secondary | ICD-10-CM | POA: Diagnosis not present

## 2022-06-20 DIAGNOSIS — G4700 Insomnia, unspecified: Secondary | ICD-10-CM | POA: Diagnosis not present

## 2022-06-20 DIAGNOSIS — R000 Tachycardia, unspecified: Secondary | ICD-10-CM | POA: Diagnosis not present

## 2022-06-20 DIAGNOSIS — E7800 Pure hypercholesterolemia, unspecified: Secondary | ICD-10-CM | POA: Diagnosis not present

## 2022-06-20 DIAGNOSIS — R109 Unspecified abdominal pain: Secondary | ICD-10-CM | POA: Diagnosis not present

## 2022-06-20 DIAGNOSIS — E2839 Other primary ovarian failure: Secondary | ICD-10-CM | POA: Diagnosis not present

## 2022-06-22 ENCOUNTER — Other Ambulatory Visit: Payer: Self-pay | Admitting: Family Medicine

## 2022-06-22 DIAGNOSIS — E2839 Other primary ovarian failure: Secondary | ICD-10-CM

## 2022-06-23 ENCOUNTER — Other Ambulatory Visit: Payer: Self-pay | Admitting: Family Medicine

## 2022-06-23 DIAGNOSIS — R109 Unspecified abdominal pain: Secondary | ICD-10-CM

## 2022-07-11 ENCOUNTER — Other Ambulatory Visit: Payer: Medicare HMO

## 2022-07-28 ENCOUNTER — Ambulatory Visit
Admission: RE | Admit: 2022-07-28 | Discharge: 2022-07-28 | Disposition: A | Discharge status: Home / Self Care | Payer: Medicare HMO | Source: Ambulatory Visit | Attending: Family Medicine | Admitting: Family Medicine

## 2022-07-28 DIAGNOSIS — M545 Low back pain, unspecified: Secondary | ICD-10-CM | POA: Diagnosis not present

## 2022-07-28 DIAGNOSIS — R102 Pelvic and perineal pain: Secondary | ICD-10-CM | POA: Diagnosis not present

## 2022-07-28 DIAGNOSIS — R109 Unspecified abdominal pain: Secondary | ICD-10-CM

## 2022-07-28 MED ORDER — IOPAMIDOL (ISOVUE-300) INJECTION 61%
100.0000 mL | Freq: Once | INTRAVENOUS | Status: AC | PRN
  Administered 2022-07-28: 100 mL via INTRAVENOUS

## 2022-09-05 DIAGNOSIS — E039 Hypothyroidism, unspecified: Secondary | ICD-10-CM | POA: Diagnosis not present

## 2022-11-02 DIAGNOSIS — E039 Hypothyroidism, unspecified: Secondary | ICD-10-CM | POA: Diagnosis not present

## 2022-12-20 DIAGNOSIS — H543 Unqualified visual loss, both eyes: Secondary | ICD-10-CM | POA: Diagnosis not present

## 2022-12-20 DIAGNOSIS — Z23 Encounter for immunization: Secondary | ICD-10-CM | POA: Diagnosis not present

## 2022-12-20 DIAGNOSIS — G4700 Insomnia, unspecified: Secondary | ICD-10-CM | POA: Diagnosis not present

## 2022-12-20 DIAGNOSIS — E7800 Pure hypercholesterolemia, unspecified: Secondary | ICD-10-CM | POA: Diagnosis not present

## 2022-12-20 DIAGNOSIS — Z0000 Encounter for general adult medical examination without abnormal findings: Secondary | ICD-10-CM | POA: Diagnosis not present

## 2022-12-20 DIAGNOSIS — K219 Gastro-esophageal reflux disease without esophagitis: Secondary | ICD-10-CM | POA: Diagnosis not present

## 2022-12-20 DIAGNOSIS — E039 Hypothyroidism, unspecified: Secondary | ICD-10-CM | POA: Diagnosis not present

## 2022-12-20 DIAGNOSIS — Z1331 Encounter for screening for depression: Secondary | ICD-10-CM | POA: Diagnosis not present

## 2022-12-20 DIAGNOSIS — R000 Tachycardia, unspecified: Secondary | ICD-10-CM | POA: Diagnosis not present

## 2022-12-29 ENCOUNTER — Encounter: Payer: Self-pay | Admitting: Obstetrics

## 2022-12-29 ENCOUNTER — Encounter: Payer: Self-pay | Admitting: Orthopedic Surgery

## 2023-01-10 ENCOUNTER — Other Ambulatory Visit: Payer: Medicare HMO

## 2023-01-24 DIAGNOSIS — D3132 Benign neoplasm of left choroid: Secondary | ICD-10-CM | POA: Diagnosis not present

## 2023-02-01 DIAGNOSIS — Z0100 Encounter for examination of eyes and vision without abnormal findings: Secondary | ICD-10-CM | POA: Diagnosis not present
